# Patient Record
Sex: Female | Born: 1940 | Race: Black or African American | Hispanic: No | Smoking: Former smoker
Health system: Southern US, Community
[De-identification: ages and names within clinical notes are randomized; demographics above are authoritative.]

## PROBLEM LIST (undated history)

## (undated) DIAGNOSIS — I10 Essential (primary) hypertension: Secondary | ICD-10-CM

## (undated) DIAGNOSIS — Z8489 Family history of other specified conditions: Secondary | ICD-10-CM

## (undated) DIAGNOSIS — M1990 Unspecified osteoarthritis, unspecified site: Secondary | ICD-10-CM

## (undated) HISTORY — PX: JOINT REPLACEMENT: SHX530

## (undated) HISTORY — DX: Essential (primary) hypertension: I10

---

## 2004-05-13 ENCOUNTER — Other Ambulatory Visit: Admission: RE | Admit: 2004-05-13 | Discharge: 2004-05-13 | Payer: Self-pay

## 2006-04-22 ENCOUNTER — Encounter (INDEPENDENT_AMBULATORY_CARE_PROVIDER_SITE_OTHER): Payer: Self-pay | Admitting: Family Medicine

## 2006-11-30 ENCOUNTER — Ambulatory Visit: Payer: Self-pay | Admitting: Family Medicine

## 2006-11-30 DIAGNOSIS — E669 Obesity, unspecified: Secondary | ICD-10-CM | POA: Insufficient documentation

## 2006-11-30 DIAGNOSIS — I10 Essential (primary) hypertension: Secondary | ICD-10-CM | POA: Insufficient documentation

## 2006-11-30 DIAGNOSIS — M129 Arthropathy, unspecified: Secondary | ICD-10-CM | POA: Insufficient documentation

## 2006-11-30 LAB — CONVERTED CEMR LAB
Cholesterol, target level: 200 mg/dL
HDL goal, serum: 40 mg/dL
LDL Goal: 130 mg/dL

## 2006-12-04 ENCOUNTER — Encounter (INDEPENDENT_AMBULATORY_CARE_PROVIDER_SITE_OTHER): Payer: Self-pay | Admitting: Family Medicine

## 2006-12-06 ENCOUNTER — Telehealth (INDEPENDENT_AMBULATORY_CARE_PROVIDER_SITE_OTHER): Payer: Self-pay | Admitting: *Deleted

## 2006-12-08 LAB — CONVERTED CEMR LAB
ALT: 13 units/L (ref 0–35)
AST: 18 units/L (ref 0–37)
Basophils Absolute: 0 10*3/uL (ref 0.0–0.1)
Basophils Relative: 0 % (ref 0–1)
CO2: 27 meq/L (ref 19–32)
Calcium: 9.3 mg/dL (ref 8.4–10.5)
Chloride: 103 meq/L (ref 96–112)
Cholesterol: 195 mg/dL (ref 0–200)
Hemoglobin: 13.3 g/dL (ref 12.0–15.0)
Lymphocytes Relative: 41 % (ref 12–46)
MCHC: 31.7 g/dL (ref 30.0–36.0)
Neutro Abs: 3.6 10*3/uL (ref 1.7–7.7)
Neutrophils Relative %: 48 % (ref 43–77)
Platelets: 254 10*3/uL (ref 150–400)
RDW: 13.1 % (ref 11.5–15.5)
Sodium: 142 meq/L (ref 135–145)
TSH: 2.085 microintl units/mL (ref 0.350–5.50)
Total Protein: 7.2 g/dL (ref 6.0–8.3)
VLDL: 12 mg/dL (ref 0–40)

## 2006-12-09 ENCOUNTER — Encounter (INDEPENDENT_AMBULATORY_CARE_PROVIDER_SITE_OTHER): Payer: Self-pay | Admitting: Family Medicine

## 2007-01-07 ENCOUNTER — Ambulatory Visit: Payer: Self-pay | Admitting: Family Medicine

## 2007-01-07 DIAGNOSIS — R748 Abnormal levels of other serum enzymes: Secondary | ICD-10-CM | POA: Insufficient documentation

## 2007-01-07 LAB — CONVERTED CEMR LAB: LDL Goal: 160 mg/dL

## 2007-02-18 ENCOUNTER — Encounter (INDEPENDENT_AMBULATORY_CARE_PROVIDER_SITE_OTHER): Payer: Self-pay | Admitting: Family Medicine

## 2007-02-18 ENCOUNTER — Ambulatory Visit: Payer: Self-pay | Admitting: Family Medicine

## 2007-02-18 ENCOUNTER — Telehealth (INDEPENDENT_AMBULATORY_CARE_PROVIDER_SITE_OTHER): Payer: Self-pay | Admitting: *Deleted

## 2007-02-18 ENCOUNTER — Other Ambulatory Visit: Admission: RE | Admit: 2007-02-18 | Discharge: 2007-02-18 | Payer: Self-pay | Admitting: Family Medicine

## 2007-02-18 DIAGNOSIS — M949 Disorder of cartilage, unspecified: Secondary | ICD-10-CM

## 2007-02-18 DIAGNOSIS — M899 Disorder of bone, unspecified: Secondary | ICD-10-CM | POA: Insufficient documentation

## 2007-02-22 ENCOUNTER — Telehealth (INDEPENDENT_AMBULATORY_CARE_PROVIDER_SITE_OTHER): Payer: Self-pay | Admitting: *Deleted

## 2007-02-22 ENCOUNTER — Encounter (INDEPENDENT_AMBULATORY_CARE_PROVIDER_SITE_OTHER): Payer: Self-pay | Admitting: Family Medicine

## 2007-02-22 ENCOUNTER — Ambulatory Visit (HOSPITAL_COMMUNITY): Admission: RE | Admit: 2007-02-22 | Discharge: 2007-02-22 | Payer: Self-pay | Admitting: Family Medicine

## 2007-02-22 ENCOUNTER — Encounter: Payer: Self-pay | Admitting: Orthopedic Surgery

## 2007-02-23 ENCOUNTER — Telehealth (INDEPENDENT_AMBULATORY_CARE_PROVIDER_SITE_OTHER): Payer: Self-pay | Admitting: Family Medicine

## 2007-03-14 LAB — CONVERTED CEMR LAB: Pap Smear: NORMAL

## 2007-03-18 ENCOUNTER — Encounter (INDEPENDENT_AMBULATORY_CARE_PROVIDER_SITE_OTHER): Payer: Self-pay | Admitting: Family Medicine

## 2007-08-05 ENCOUNTER — Ambulatory Visit: Payer: Self-pay | Admitting: Family Medicine

## 2007-08-05 LAB — CONVERTED CEMR LAB
Bilirubin Urine: NEGATIVE
Blood in Urine, dipstick: NEGATIVE
Ketones, urine, test strip: NEGATIVE
Specific Gravity, Urine: 1.01
Urobilinogen, UA: 0.2

## 2007-08-06 ENCOUNTER — Encounter (INDEPENDENT_AMBULATORY_CARE_PROVIDER_SITE_OTHER): Payer: Self-pay | Admitting: Family Medicine

## 2007-08-09 LAB — CONVERTED CEMR LAB
BUN: 15 mg/dL (ref 6–23)
Calcium: 9.1 mg/dL (ref 8.4–10.5)
Glucose, Bld: 77 mg/dL (ref 70–99)
Potassium: 3.4 meq/L — ABNORMAL LOW (ref 3.5–5.3)

## 2007-12-08 ENCOUNTER — Encounter (INDEPENDENT_AMBULATORY_CARE_PROVIDER_SITE_OTHER): Payer: Self-pay | Admitting: Family Medicine

## 2007-12-29 ENCOUNTER — Ambulatory Visit: Payer: Self-pay | Admitting: Family Medicine

## 2008-04-23 ENCOUNTER — Ambulatory Visit: Payer: Self-pay | Admitting: Family Medicine

## 2008-04-28 ENCOUNTER — Encounter (INDEPENDENT_AMBULATORY_CARE_PROVIDER_SITE_OTHER): Payer: Self-pay | Admitting: Family Medicine

## 2008-05-01 ENCOUNTER — Encounter (INDEPENDENT_AMBULATORY_CARE_PROVIDER_SITE_OTHER): Payer: Self-pay | Admitting: *Deleted

## 2008-05-01 LAB — CONVERTED CEMR LAB
ALT: 12 units/L (ref 0–35)
AST: 18 units/L (ref 0–37)
Albumin: 3.9 g/dL (ref 3.5–5.2)
CO2: 27 meq/L (ref 19–32)
Calcium: 9.5 mg/dL (ref 8.4–10.5)
Chloride: 102 meq/L (ref 96–112)
Cholesterol: 181 mg/dL (ref 0–200)
Creatinine, Ser: 0.71 mg/dL (ref 0.40–1.20)
Lymphocytes Relative: 42 % (ref 12–46)
Lymphs Abs: 3.8 10*3/uL (ref 0.7–4.0)
MCV: 90.6 fL (ref 78.0–100.0)
Monocytes Relative: 10 % (ref 3–12)
Neutro Abs: 4.2 10*3/uL (ref 1.7–7.7)
Neutrophils Relative %: 46 % (ref 43–77)
Platelets: 284 10*3/uL (ref 150–400)
Potassium: 3.4 meq/L — ABNORMAL LOW (ref 3.5–5.3)
RBC: 4.45 M/uL (ref 3.87–5.11)
Sodium: 141 meq/L (ref 135–145)
TSH: 2.398 microintl units/mL (ref 0.350–4.500)
Total CHOL/HDL Ratio: 3
Total Protein: 7.5 g/dL (ref 6.0–8.3)
WBC: 9.1 10*3/uL (ref 4.0–10.5)

## 2008-07-18 ENCOUNTER — Ambulatory Visit: Payer: Self-pay | Admitting: Family Medicine

## 2008-07-18 DIAGNOSIS — E876 Hypokalemia: Secondary | ICD-10-CM | POA: Insufficient documentation

## 2008-07-18 LAB — CONVERTED CEMR LAB
Cholesterol, target level: 200 mg/dL
LDL Goal: 130 mg/dL

## 2008-07-19 ENCOUNTER — Encounter (INDEPENDENT_AMBULATORY_CARE_PROVIDER_SITE_OTHER): Payer: Self-pay | Admitting: Family Medicine

## 2008-07-19 LAB — CONVERTED CEMR LAB
BUN: 15 mg/dL (ref 6–23)
Calcium: 8.9 mg/dL (ref 8.4–10.5)
Glucose, Bld: 93 mg/dL (ref 70–99)
Sodium: 141 meq/L (ref 135–145)

## 2008-09-28 ENCOUNTER — Encounter (INDEPENDENT_AMBULATORY_CARE_PROVIDER_SITE_OTHER): Payer: Self-pay | Admitting: Family Medicine

## 2010-05-01 ENCOUNTER — Other Ambulatory Visit (HOSPITAL_COMMUNITY): Payer: Self-pay | Admitting: Internal Medicine

## 2010-05-01 DIAGNOSIS — Z139 Encounter for screening, unspecified: Secondary | ICD-10-CM

## 2010-05-07 ENCOUNTER — Ambulatory Visit (HOSPITAL_COMMUNITY)
Admission: RE | Admit: 2010-05-07 | Discharge: 2010-05-07 | Disposition: A | Discharge status: Home / Self Care | Payer: Medicare PPO | Source: Ambulatory Visit | Attending: Internal Medicine | Admitting: Internal Medicine

## 2010-05-07 DIAGNOSIS — Z139 Encounter for screening, unspecified: Secondary | ICD-10-CM

## 2010-05-07 DIAGNOSIS — Z13820 Encounter for screening for osteoporosis: Secondary | ICD-10-CM | POA: Insufficient documentation

## 2010-05-07 DIAGNOSIS — Z780 Asymptomatic menopausal state: Secondary | ICD-10-CM | POA: Insufficient documentation

## 2011-06-16 ENCOUNTER — Ambulatory Visit (HOSPITAL_COMMUNITY)
Admission: RE | Admit: 2011-06-16 | Discharge: 2011-06-16 | Disposition: A | Discharge status: Home / Self Care | Payer: Medicare HMO | Source: Ambulatory Visit | Attending: *Deleted | Admitting: *Deleted

## 2011-06-16 ENCOUNTER — Other Ambulatory Visit (HOSPITAL_COMMUNITY): Payer: Self-pay | Admitting: *Deleted

## 2011-06-16 DIAGNOSIS — M25669 Stiffness of unspecified knee, not elsewhere classified: Secondary | ICD-10-CM | POA: Insufficient documentation

## 2011-06-16 DIAGNOSIS — M25569 Pain in unspecified knee: Secondary | ICD-10-CM | POA: Insufficient documentation

## 2011-06-16 DIAGNOSIS — M25562 Pain in left knee: Secondary | ICD-10-CM

## 2011-06-16 DIAGNOSIS — M1710 Unilateral primary osteoarthritis, unspecified knee: Secondary | ICD-10-CM | POA: Insufficient documentation

## 2011-06-16 DIAGNOSIS — IMO0002 Reserved for concepts with insufficient information to code with codable children: Secondary | ICD-10-CM | POA: Insufficient documentation

## 2011-07-06 ENCOUNTER — Encounter: Payer: Self-pay | Admitting: Orthopedic Surgery

## 2011-07-06 ENCOUNTER — Ambulatory Visit (INDEPENDENT_AMBULATORY_CARE_PROVIDER_SITE_OTHER): Payer: Medicare HMO | Admitting: Orthopedic Surgery

## 2011-07-06 VITALS — BP 140/90 | Ht 67.0 in | Wt 197.0 lb

## 2011-07-06 DIAGNOSIS — M25469 Effusion, unspecified knee: Secondary | ICD-10-CM

## 2011-07-06 DIAGNOSIS — M25462 Effusion, left knee: Secondary | ICD-10-CM | POA: Insufficient documentation

## 2011-07-06 DIAGNOSIS — M25569 Pain in unspecified knee: Secondary | ICD-10-CM

## 2011-07-06 NOTE — Progress Notes (Signed)
  Subjective:    April Gordon is a 71 y.o. female who presents for an evaluation for spontaneous onset of LEFT knee effusion with no history of trauma. The patient went to her primary care doctor for a urinary tract infection, took some antibiotics and then started having swelling in the LEFT knee associated with throbbing 6/10. Constant pain, but no loss of motion.  Review of systems reported as normal. The following portions of the patient's history were reviewed and updated as appropriate: allergies, current medications, past family history, past medical history, past social history, past surgical history and problem list.   Review of Systems Pertinent items are noted in HPI.   Vital signs are stable as recorded  General appearance is normal  The patient is alert and oriented x3  The patient's mood and affect are normal  Gait assessment: normal  The cardiovascular exam reveals normal pulses and temperature without edema swelling.  The lymphatic system is negative for palpable lymph nodes  The sensory exam is normal.  There are no pathologic reflexes.  Balance is normal.   Exam of the LEFT knee, large joint effusion is noted. Range of motion stops at 90 of flexion. However, the knee remained stable. The muscle strength is normal. Her skin is intact.  Upper extremity exam  Inspection and palpation revealed no abnormalities in the upper extremities.  Range of motion is full without contracture.  Motor exam is normal with grade 5 strength.  The joints are fully reduced without subluxation.  There is no atrophy or tremor and muscle tone is normal.  All joints are stable.  RIGHT Lower extremity exam  Inspection and palpation revealed no tenderness or abnormality in alignment in the lower extremities. Range of motion is full.  Strength is grade 5. all joints are stable  The x-rays were done at the hospital. They show arthritis with effusion.  Impression LEFT knee joint  effusion with underlying arthritis.  Plan aspirate and inject his LEFT knee.  I expect spontaneous resolution of his of cortisone injection.  Follow up as needed    Knee Arthrocentesis with Injection Procedure Note  Pre-operative Diagnosis: left knee effusion  Post-operative Diagnosis: same  Indications: Symptomatic relief of large effusion  Anesthesia: Ethel chloride  Procedure Details   Verbal consent was obtained for the procedure. The joint was prepped with Alcohol lateral approach, 18-gauge needle inserted and the knee joint. 50 cc of cloudy yellow fluid was removed.  The knee was injected with 40 mg of Depo-Medrol and 3 cc 1% lidocaine.  No complications

## 2011-07-06 NOTE — Patient Instructions (Addendum)
You have received a steroid shot. 15% of patients experience increased pain at the injection site with in the next 24 hours. This is best treated with ice and tylenol extra strength 2 tabs every 8 hours. If you are still having pain please call the office.    

## 2013-09-18 ENCOUNTER — Ambulatory Visit (INDEPENDENT_AMBULATORY_CARE_PROVIDER_SITE_OTHER): Payer: Commercial Managed Care - HMO

## 2013-09-18 ENCOUNTER — Ambulatory Visit (INDEPENDENT_AMBULATORY_CARE_PROVIDER_SITE_OTHER): Payer: Commercial Managed Care - HMO | Admitting: Orthopedic Surgery

## 2013-09-18 VITALS — BP 149/81 | Ht 67.0 in | Wt 200.8 lb

## 2013-09-18 DIAGNOSIS — M25562 Pain in left knee: Secondary | ICD-10-CM

## 2013-09-18 DIAGNOSIS — M1710 Unilateral primary osteoarthritis, unspecified knee: Secondary | ICD-10-CM

## 2013-09-18 DIAGNOSIS — M170 Bilateral primary osteoarthritis of knee: Secondary | ICD-10-CM

## 2013-09-18 DIAGNOSIS — M25569 Pain in unspecified knee: Secondary | ICD-10-CM

## 2013-09-18 NOTE — Patient Instructions (Signed)
Joint Injection  Care After  Refer to this sheet in the next few days. These instructions provide you with information on caring for yourself after you have had a joint injection. Your caregiver also may give you more specific instructions. Your treatment has been planned according to current medical practices, but problems sometimes occur. Call your caregiver if you have any problems or questions after your procedure.  After any type of joint injection, it is not uncommon to experience:  · Soreness, swelling, or bruising around the injection site.  · Mild numbness, tingling, or weakness around the injection site caused by the numbing medicine used before or with the injection.  It also is possible to experience the following effects associated with the specific agent after injection:  · Iodine-based contrast agents:  ¨ Allergic reaction (itching, hives, widespread redness, and swelling beyond the injection site).  · Corticosteroids (These effects are rare.):  ¨ Allergic reaction.  ¨ Increased blood sugar levels (If you have diabetes and you notice that your blood sugar levels have increased, notify your caregiver).  ¨ Increased blood pressure levels.  ¨ Mood swings.  · Hyaluronic acid in the use of viscosupplementation.  ¨ Temporary heat or redness.  ¨ Temporary rash and itching.  ¨ Increased fluid accumulation in the injected joint.  These effects all should resolve within a day after your procedure.   HOME CARE INSTRUCTIONS  · Limit yourself to light activity the day of your procedure. Avoid lifting heavy objects, bending, stooping, or twisting.  · Take prescription or over-the-counter pain medication as directed by your caregiver.  · You may apply ice to your injection site to reduce pain and swelling the day of your procedure. Ice may be applied 03-04 times:  ¨ Put ice in a plastic bag.  ¨ Place a towel between your skin and the bag.  ¨ Leave the ice on for no longer than 15-20 minutes each time.  SEEK  IMMEDIATE MEDICAL CARE IF:   · Pain and swelling get worse rather than better or extend beyond the injection site.  · Numbness does not go away.  · Blood or fluid continues to leak from the injection site.  · You have chest pain.  · You have swelling of your face or tongue.  · You have trouble breathing or you become dizzy.  · You develop a fever, chills, or severe tenderness at the injection site that last longer than 1 day.  MAKE SURE YOU:  · Understand these instructions.  · Watch your condition.  · Get help right away if you are not doing well or if you get worse.  Document Released: 09/18/2010 Document Revised: 03/30/2011 Document Reviewed: 09/18/2010  ExitCare® Patient Information ©2015 [employer]. This information is not intended to replace advice given to you by your health care provider. Make sure you discuss any questions you have with your health care provider.

## 2013-09-21 NOTE — Progress Notes (Signed)
New problem established patient last seen 07/06/2011 presents now with bilateral knee pain left greater than right for about 6 weeks. She complains of throbbing aching pain and swelling with stiffness of both knees left worse than right it is increased after activity and worse at night she takes Aleve and Tylenol she's had trouble with these in the past. Review of systems joint pain stiffness joint spurring pain her legs a ON leg swelling otherwise normal medical history of hypertension arthritis did not list any surgery she says she takes hydrochlorothiazide and verapamil no allergies are listed. 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 Time out was completed.The joint was prepped with alcohol, followed by  Ethyl chloride spray and A 20 gauge needle was inserted into the knee via lateral approach; 74ml 1% lidocaine and 1 ml of depomedrol  was then injected into the joint . The needle was removed and the area cleansed and dressed.  Complications:  None; patient tolerated the procedure well. Knee  Injection Procedure Note  Pre-operative Diagnosis: right knee oa  Post-operative Diagnosis: same  Indications: pain  Anesthesia: ethyl chloride   Procedure Details    Verbal consent was obtained for the procedure. Time out was completed.The joint was prepped with alcohol, followed by  Ethyl chloride spray and A 20 gauge needle was inserted into the knee via lateral approach; 27ml 1% lidocaine and 1 ml of depomedrol  was then injected into the joint . The needle was removed and the area cleansed and dressed.  Complications:  None; patient tolerated the procedure well.

## 2014-02-14 DIAGNOSIS — M159 Polyosteoarthritis, unspecified: Secondary | ICD-10-CM | POA: Diagnosis not present

## 2014-02-14 DIAGNOSIS — M25561 Pain in right knee: Secondary | ICD-10-CM | POA: Diagnosis not present

## 2014-03-06 ENCOUNTER — Ambulatory Visit: Payer: Commercial Managed Care - HMO | Admitting: Orthopedic Surgery

## 2014-07-26 DIAGNOSIS — I10 Essential (primary) hypertension: Secondary | ICD-10-CM | POA: Diagnosis not present

## 2014-07-26 DIAGNOSIS — E782 Mixed hyperlipidemia: Secondary | ICD-10-CM | POA: Diagnosis not present

## 2014-07-31 DIAGNOSIS — M069 Rheumatoid arthritis, unspecified: Secondary | ICD-10-CM | POA: Diagnosis not present

## 2014-07-31 DIAGNOSIS — I10 Essential (primary) hypertension: Secondary | ICD-10-CM | POA: Diagnosis not present

## 2014-08-07 DIAGNOSIS — Z1231 Encounter for screening mammogram for malignant neoplasm of breast: Secondary | ICD-10-CM | POA: Diagnosis not present

## 2015-01-23 DIAGNOSIS — M25561 Pain in right knee: Secondary | ICD-10-CM | POA: Diagnosis not present

## 2015-01-23 DIAGNOSIS — I10 Essential (primary) hypertension: Secondary | ICD-10-CM | POA: Diagnosis not present

## 2015-01-23 DIAGNOSIS — M159 Polyosteoarthritis, unspecified: Secondary | ICD-10-CM | POA: Diagnosis not present

## 2015-02-02 DIAGNOSIS — I10 Essential (primary) hypertension: Secondary | ICD-10-CM | POA: Diagnosis not present

## 2015-02-02 DIAGNOSIS — M169 Osteoarthritis of hip, unspecified: Secondary | ICD-10-CM | POA: Diagnosis not present

## 2015-07-11 DIAGNOSIS — I10 Essential (primary) hypertension: Secondary | ICD-10-CM | POA: Diagnosis not present

## 2015-07-13 DIAGNOSIS — M169 Osteoarthritis of hip, unspecified: Secondary | ICD-10-CM | POA: Diagnosis not present

## 2015-07-13 DIAGNOSIS — I10 Essential (primary) hypertension: Secondary | ICD-10-CM | POA: Diagnosis not present

## 2015-12-06 DIAGNOSIS — Z23 Encounter for immunization: Secondary | ICD-10-CM | POA: Diagnosis not present

## 2016-03-24 DIAGNOSIS — I10 Essential (primary) hypertension: Secondary | ICD-10-CM | POA: Diagnosis not present

## 2016-04-01 DIAGNOSIS — I10 Essential (primary) hypertension: Secondary | ICD-10-CM | POA: Diagnosis not present

## 2016-04-01 DIAGNOSIS — Z0000 Encounter for general adult medical examination without abnormal findings: Secondary | ICD-10-CM | POA: Diagnosis not present

## 2016-04-30 ENCOUNTER — Other Ambulatory Visit: Payer: Self-pay | Admitting: Internal Medicine

## 2016-04-30 ENCOUNTER — Ambulatory Visit (HOSPITAL_COMMUNITY)
Admission: RE | Admit: 2016-04-30 | Discharge: 2016-04-30 | Disposition: A | Discharge status: Home / Self Care | Payer: Medicare HMO | Source: Ambulatory Visit | Attending: Internal Medicine | Admitting: Internal Medicine

## 2016-04-30 DIAGNOSIS — M1611 Unilateral primary osteoarthritis, right hip: Secondary | ICD-10-CM | POA: Insufficient documentation

## 2016-04-30 DIAGNOSIS — M25551 Pain in right hip: Secondary | ICD-10-CM

## 2016-04-30 DIAGNOSIS — R102 Pelvic and perineal pain: Secondary | ICD-10-CM | POA: Diagnosis not present

## 2016-04-30 DIAGNOSIS — Z0000 Encounter for general adult medical examination without abnormal findings: Secondary | ICD-10-CM | POA: Diagnosis not present

## 2016-05-04 DIAGNOSIS — R1030 Lower abdominal pain, unspecified: Secondary | ICD-10-CM | POA: Diagnosis not present

## 2016-05-04 DIAGNOSIS — M791 Myalgia: Secondary | ICD-10-CM | POA: Diagnosis not present

## 2016-05-04 DIAGNOSIS — M159 Polyosteoarthritis, unspecified: Secondary | ICD-10-CM | POA: Diagnosis not present

## 2016-05-20 DIAGNOSIS — M1611 Unilateral primary osteoarthritis, right hip: Secondary | ICD-10-CM | POA: Diagnosis not present

## 2016-05-20 DIAGNOSIS — M25551 Pain in right hip: Secondary | ICD-10-CM | POA: Diagnosis not present

## 2016-06-04 ENCOUNTER — Other Ambulatory Visit (HOSPITAL_COMMUNITY): Payer: Self-pay | Admitting: Emergency Medicine

## 2016-06-04 NOTE — Patient Instructions (Signed)
April Gordon  06/04/2016   Your procedure is scheduled on: 06-16-16  Report to April Gordon  Entrance  Report to admitting at [facility]   Call this number if you have problems the morning of surgery [phone]   Remember: ONLY 1 PERSON MAY GO WITH YOU TO SHORT STAY TO GET  READY MORNING OF YOUR SURGERY.  Do not eat food or drink liquids :After Midnight.     Take these medicines the morning of surgery with A SIP OF WATER: verapamil                                 You may not have any metal on your body including hair pins and              piercings  Do not wear jewelry, make-up, lotions, powders or perfumes, deodorant             Do not wear nail polish.  Do not shave  48 hours prior to surgery.          Do not bring valuables to the hospital. April Gordon.  Contacts, dentures or bridgework may not be worn into surgery.  Leave suitcase in the car. After surgery it may be brought to your room.                Please read over the following fact sheets you were given: _____________________________________________________________________             [facility] - Preparing for Surgery Before surgery, you can play an important role.  Because skin is not sterile, your skin needs to be as free of germs as possible.  You can reduce the number of germs on your skin by washing with CHG (chlorahexidine gluconate) soap before surgery.  CHG is an antiseptic cleaner which kills germs and bonds with the skin to continue killing germs even after washing. Please DO NOT use if you have an allergy to CHG or antibacterial soaps.  If your skin becomes reddened/irritated stop using the CHG and inform your nurse when you arrive at Short Stay. Do not shave (including legs and underarms) for at least 48 hours prior to the first CHG shower.  You may shave your face/neck. Please follow these instructions carefully:  1.  Shower with CHG  Soap the night before surgery and the  morning of Surgery.  2.  If you choose to wash your hair, wash your hair first as usual with your  normal  shampoo.  3.  After you shampoo, rinse your hair and body thoroughly to remove the  shampoo.                           4.  Use CHG as you would any other liquid soap.  You can apply chg directly  to the skin and wash                       Gently with a scrungie or clean washcloth.  5.  Apply the CHG Soap to your body ONLY FROM THE NECK DOWN.   Do not use on face/ open  Wound or open sores. Avoid contact with eyes, ears mouth and genitals (private parts).                       Wash face,  Genitals (private parts) with your normal soap.             6.  Wash thoroughly, paying special attention to the area where your surgery  will be performed.  7.  Thoroughly rinse your body with warm water from the neck down.  8.  DO NOT shower/wash with your normal soap after using and rinsing off  the CHG Soap.                9.  Pat yourself dry with a clean towel.            10.  Wear clean pajamas.            11.  Place clean sheets on your bed the night of your first shower and do not  sleep with pets. Day of Surgery : Do not apply any lotions/deodorants the morning of surgery.  Please wear clean clothes to the hospital/surgery center.  FAILURE TO FOLLOW THESE INSTRUCTIONS MAY RESULT IN THE CANCELLATION OF YOUR SURGERY PATIENT SIGNATURE_________________________________  NURSE SIGNATURE__________________________________  ________________________________________________________________________   April Gordon  An incentive spirometer is a tool that can help keep your lungs clear and active. This tool measures how well you are filling your lungs with each breath. Taking long deep breaths may help reverse or decrease the chance of developing breathing (pulmonary) problems (especially infection) following:  A long period of time  when you are unable to move or be active. BEFORE THE PROCEDURE   If the spirometer includes an indicator to show your best effort, your nurse or respiratory therapist will set it to a desired goal.  If possible, sit up straight or lean slightly forward. Try not to slouch.  Hold the incentive spirometer in an upright position. INSTRUCTIONS FOR USE  1. Sit on the edge of your bed if possible, or sit up as far as you can in bed or on a chair. 2. Hold the incentive spirometer in an upright position. 3. Breathe out normally. 4. Place the mouthpiece in your mouth and seal your lips tightly around it. 5. Breathe in slowly and as deeply as possible, raising the piston or the ball toward the top of the column. 6. Hold your breath for 3-5 seconds or for as long as possible. Allow the piston or ball to fall to the bottom of the column. 7. Remove the mouthpiece from your mouth and breathe out normally. 8. Rest for a few seconds and repeat Steps 1 through 7 at least 10 times every 1-2 hours when you are awake. Take your time and take a few normal breaths between deep breaths. 9. The spirometer may include an indicator to show your best effort. Use the indicator as a goal to work toward during each repetition. 10. After each set of 10 deep breaths, practice coughing to be sure your lungs are clear. If you have an incision (the cut made at the time of surgery), support your incision when coughing by placing a pillow or rolled up towels firmly against it. Once you are able to get out of bed, walk around indoors and cough well. You may stop using the incentive spirometer when instructed by your caregiver.  RISKS AND COMPLICATIONS  Take your time so you do not get  dizzy or light-headed.  If you are in pain, you may need to take or ask for pain medication before doing incentive spirometry. It is harder to take a deep breath if you are having pain. AFTER USE  Rest and breathe slowly and easily.  It can be  helpful to keep track of a log of your progress. Your caregiver can provide you with a simple table to help with this. If you are using the spirometer at home, follow these instructions: [place] IF:   You are having difficultly using the spirometer.  You have trouble using the spirometer as often as instructed.  Your pain medication is not giving enough relief while using the spirometer.  You develop fever of 100.5 F (38.1 C) or higher. SEEK IMMEDIATE MEDICAL CARE IF:   You cough up bloody sputum that had not been present before.  You develop fever of 102 F (38.9 C) or greater.  You develop worsening pain at or near the incision site. MAKE SURE YOU:   Understand these instructions.  Will watch your condition.  Will get help right away if you are not doing well or get worse. Document Released: 05/18/2006 Document Revised: 03/30/2011 Document Reviewed: 07/19/2006 Exit[employer] Patient Information 2014 [employer], [place].   ________________________________________________________________________  WHAT IS A BLOOD TRANSFUSION? Blood Transfusion Information  A transfusion is the replacement of blood or some of its parts. Blood is made up of multiple cells which provide different functions.  Red blood cells carry oxygen and are used for blood loss replacement.  White blood cells fight against infection.  Platelets control bleeding.  Plasma helps clot blood.  Other blood products are available for specialized needs, such as hemophilia or other clotting disorders. BEFORE THE TRANSFUSION  Who gives blood for transfusions?   Healthy volunteers who are fully evaluated to make sure their blood is safe. This is blood bank blood. Transfusion therapy is the safest it has ever been in the practice of medicine. Before blood is taken from a donor, a complete history is taken to make sure that person has no history of diseases nor engages in risky social behavior (examples are  intravenous drug use or sexual activity with multiple partners). The donor's travel history is screened to minimize risk of transmitting infections, such as malaria. The donated blood is tested for signs of infectious diseases, such as HIV and hepatitis. The blood is then tested to be sure it is compatible with you in order to minimize the chance of a transfusion reaction. If you or a relative donates blood, this is often done in anticipation of surgery and is not appropriate for emergency situations. It takes many days to process the donated blood. RISKS AND COMPLICATIONS Although transfusion therapy is very safe and saves many lives, the main dangers of transfusion include:   Getting an infectious disease.  Developing a transfusion reaction. This is an allergic reaction to something in the blood you were given. Every precaution is taken to prevent this. The decision to have a blood transfusion has been considered carefully by your caregiver before blood is given. Blood is not given unless the benefits outweigh the risks. AFTER THE TRANSFUSION  Right after receiving a blood transfusion, you will usually feel much better and more energetic. This is especially true if your red blood cells have gotten low (anemic). The transfusion raises the level of the red blood cells which carry oxygen, and this usually causes an energy increase.  The nurse administering the transfusion will  monitor you carefully for complications. HOME CARE INSTRUCTIONS  No special instructions are needed after a transfusion. You may find your energy is better. Speak with your caregiver about any limitations on activity for underlying diseases you may have. SEEK MEDICAL CARE IF:   Your condition is not improving after your transfusion.  You develop redness or irritation at the intravenous (IV) site. SEEK IMMEDIATE MEDICAL CARE IF:  Any of the following symptoms occur over the next 12 hours:  Shaking chills.  You have a  temperature by mouth above 102 F (38.9 C), not controlled by medicine.  Chest, back, or muscle pain.  People around you feel you are not acting correctly or are confused.  Shortness of breath or difficulty breathing.  Dizziness and fainting.  You get a rash or develop hives.  You have a decrease in urine output.  Your urine turns a dark color or changes to pink, red, or brown. Any of the following symptoms occur over the next 10 days:  You have a temperature by mouth above 102 F (38.9 C), not controlled by medicine.  Shortness of breath.  Weakness after normal activity.  The white part of the eye turns yellow (jaundice).  You have a decrease in the amount of urine or are urinating less often.  Your urine turns a dark color or changes to pink, red, or brown. Document Released: 01/03/2000 Document Revised: 03/30/2011 Document Reviewed: 08/22/2007 [facility] Patient Information 2014 [place], [place].  _______________________________________________________________________

## 2016-06-05 ENCOUNTER — Encounter (HOSPITAL_COMMUNITY)
Admission: RE | Admit: 2016-06-05 | Discharge: 2016-06-05 | Disposition: A | Discharge status: Home / Self Care | Payer: Medicare HMO | Source: Ambulatory Visit | Attending: Orthopedic Surgery | Admitting: Orthopedic Surgery

## 2016-06-05 ENCOUNTER — Encounter (HOSPITAL_COMMUNITY): Payer: Self-pay

## 2016-06-05 ENCOUNTER — Encounter (INDEPENDENT_AMBULATORY_CARE_PROVIDER_SITE_OTHER): Payer: Self-pay

## 2016-06-05 DIAGNOSIS — Z01810 Encounter for preprocedural cardiovascular examination: Secondary | ICD-10-CM | POA: Diagnosis not present

## 2016-06-05 DIAGNOSIS — Z01812 Encounter for preprocedural laboratory examination: Secondary | ICD-10-CM | POA: Insufficient documentation

## 2016-06-05 DIAGNOSIS — M1611 Unilateral primary osteoarthritis, right hip: Secondary | ICD-10-CM | POA: Diagnosis not present

## 2016-06-05 HISTORY — DX: Unspecified osteoarthritis, unspecified site: M19.90

## 2016-06-05 LAB — CBC
HEMATOCRIT: 38.9 % (ref 36.0–46.0)
HEMOGLOBIN: 13 g/dL (ref 12.0–15.0)
MCH: 30.6 pg (ref 26.0–34.0)
MCHC: 33.4 g/dL (ref 30.0–36.0)
MCV: 91.5 fL (ref 78.0–100.0)
Platelets: 259 10*3/uL (ref 150–400)
RBC: 4.25 MIL/uL (ref 3.87–5.11)
RDW: 13.1 % (ref 11.5–15.5)
WBC: 8.1 10*3/uL (ref 4.0–10.5)

## 2016-06-05 LAB — BASIC METABOLIC PANEL
ANION GAP: 8 (ref 5–15)
BUN: 22 mg/dL — ABNORMAL HIGH (ref 6–20)
CALCIUM: 9.5 mg/dL (ref 8.9–10.3)
CHLORIDE: 104 mmol/L (ref 101–111)
CO2: 29 mmol/L (ref 22–32)
Creatinine, Ser: 0.78 mg/dL (ref 0.44–1.00)
GFR calc non Af Amer: >60 mL/min (ref >60)
GLUCOSE: 90 mg/dL (ref 65–99)
POTASSIUM: 4.3 mmol/L (ref 3.5–5.1)
Sodium: 141 mmol/L (ref 135–145)

## 2016-06-05 LAB — TYPE AND SCREEN
ABO/RH(D): O POS
ANTIBODY SCREEN: NEGATIVE

## 2016-06-05 LAB — ABO/RH: ABO/RH(D): O POS

## 2016-06-05 LAB — SURGICAL PCR SCREEN
MRSA, PCR: NEGATIVE
Staphylococcus aureus: POSITIVE — AB

## 2016-06-05 NOTE — Progress Notes (Signed)
[address]ance Dr [place] Crane  05-27-16 on chart

## 2016-06-10 NOTE — H&P (Signed)
TOTAL HIP ADMISSION H&P  Patient is admitted for right total hip arthroplasty, anterior approach.  Subjective:  Chief Complaint:    Right hip primary OA / pain  HPI: April Gordon, 76 y.o. female, has a history of pain and functional disability in the right hip(s) due to arthritis and patient has failed non-surgical conservative treatments for greater than 12 weeks to include NSAID's and/or analgesics, use of assistive devices and activity modification.  Onset of symptoms was gradual starting years ago with gradually worsening course since that time.The patient noted no past surgery on the right hip(s).  Patient currently rates pain in the right hip at 9 out of 10 with activity. Patient has worsening of pain with activity and weight bearing, trendelenberg gait, pain that interfers with activities of daily living and pain with passive range of motion. Patient has evidence of periarticular osteophytes and joint subluxation by imaging studies. This condition presents safety issues increasing the risk of falls.  There is no current active infection.   Risks, benefits and expectations were discussed with the patient.  Risks including but not limited to the risk of anesthesia, blood clots, nerve damage, blood vessel damage, failure of the prosthesis, infection and up to and including death.  Patient understand the risks, benefits and expectations and wishes to proceed with surgery.   PCP: Celene Squibb, MD  D/C Plans:       Home   Post-op Meds:       No Rx given   Tranexamic Acid:      To be given - IV   Decadron:      Is to be given  FYI:     ASA  [place]  PT: No PT    Patient Active Problem List   Diagnosis Date Noted  . Effusion of knee joint, left 07/06/2011  . HYPOKALEMIA 07/18/2008  . OSTEOPENIA 02/18/2007  . ALKALINE PHOSPHATASE, ELEVATED 01/07/2007  . OBESITY 11/30/2006  . HYPERTENSION 11/30/2006  . ARTHRITIS 11/30/2006   Past Medical History:  Diagnosis Date  . Arthritis   . HTN  (hypertension)     No past surgical history on file.  No prescriptions prior to admission.   No Known Allergies   Social History  Substance Use Topics  . Smoking status: Former Smoker    Years: 6.00  . Smokeless tobacco: Never Used  . Alcohol use No    Family History  Problem Relation Age of Onset  . Arthritis Unknown      Review of Systems  Constitutional: Negative.   HENT: Negative.   Eyes: Negative.   Respiratory: Negative.   Cardiovascular: Negative.   Gastrointestinal: Negative.   Genitourinary: Negative.   Musculoskeletal: Positive for joint pain.  Skin: Negative.   Neurological: Negative.   Endo/Heme/Allergies: Negative.   Psychiatric/Behavioral: Negative.     Objective:  Physical Exam  Constitutional: She is oriented to person, place, and time. She appears well-developed.  HENT:  Head: Normocephalic.  Eyes: Pupils are equal, round, and reactive to light.  Neck: Neck supple. No JVD present. No tracheal deviation present. No thyromegaly present.  Cardiovascular: Normal rate, regular rhythm and intact distal pulses.   Respiratory: Effort normal and breath sounds normal. No respiratory distress. She has no wheezes.  GI: Soft. There is no tenderness. There is no guarding.  Musculoskeletal:       Right hip: She exhibits decreased range of motion, decreased strength, tenderness and bony tenderness. She exhibits no swelling, no deformity and no laceration.  Lymphadenopathy:    She has no cervical adenopathy.  Neurological: She is alert and oriented to person, place, and time.  Skin: Skin is warm and dry.  Psychiatric: She has a normal mood and affect.      Labs:  Estimated body mass index is 31.28 kg/m as calculated from the following:   Height as of 06/05/16: 5\' 6"  (1.676 m).   Weight as of 06/05/16: 87.9 kg (193 lb 12.8 oz).   Imaging Review Plain radiographs demonstrate severe degenerative joint disease of the right hip(s). The bone quality appears to  be good for age and reported activity level.  Assessment/Plan:  End stage arthritis, right hip(s)  The patient history, physical examination, clinical judgement of the provider and imaging studies are consistent with end stage degenerative joint disease of the right hip(s) and total hip arthroplasty is deemed medically necessary. The treatment options including medical management, injection therapy, arthroscopy and arthroplasty were discussed at length. The risks and benefits of total hip arthroplasty were presented and reviewed. The risks due to aseptic loosening, infection, stiffness, dislocation/subluxation,  thromboembolic complications and other imponderables were discussed.  The patient acknowledged the explanation, agreed to proceed with the plan and consent was signed. Patient is being admitted for inpatient treatment for surgery, pain control, PT, OT, prophylactic antibiotics, VTE prophylaxis, progressive ambulation and ADL's and discharge planning.The patient is planning to be discharged home.     West Pugh Yunus [place]   PA-C  06/10/2016, 1:56 PM

## 2016-06-16 ENCOUNTER — Inpatient Hospital Stay (HOSPITAL_COMMUNITY): Payer: Medicare HMO

## 2016-06-16 ENCOUNTER — Encounter (HOSPITAL_COMMUNITY)
Admission: RE | Disposition: A | Discharge status: Home / Self Care | Payer: Self-pay | Source: Ambulatory Visit | Attending: Orthopedic Surgery

## 2016-06-16 ENCOUNTER — Inpatient Hospital Stay (HOSPITAL_COMMUNITY): Payer: Medicare HMO | Admitting: Anesthesiology

## 2016-06-16 ENCOUNTER — Inpatient Hospital Stay (HOSPITAL_COMMUNITY)
Admission: RE | Admit: 2016-06-16 | Discharge: 2016-06-17 | DRG: 470 | Disposition: A | Discharge status: Home / Self Care | Payer: Medicare HMO | Source: Ambulatory Visit | Attending: Orthopedic Surgery | Admitting: Orthopedic Surgery

## 2016-06-16 ENCOUNTER — Encounter (HOSPITAL_COMMUNITY): Payer: Self-pay | Admitting: General Practice

## 2016-06-16 DIAGNOSIS — Z96641 Presence of right artificial hip joint: Secondary | ICD-10-CM | POA: Diagnosis not present

## 2016-06-16 DIAGNOSIS — E669 Obesity, unspecified: Secondary | ICD-10-CM | POA: Diagnosis not present

## 2016-06-16 DIAGNOSIS — M1611 Unilateral primary osteoarthritis, right hip: Secondary | ICD-10-CM | POA: Diagnosis not present

## 2016-06-16 DIAGNOSIS — Z96649 Presence of unspecified artificial hip joint: Secondary | ICD-10-CM

## 2016-06-16 DIAGNOSIS — I10 Essential (primary) hypertension: Secondary | ICD-10-CM | POA: Diagnosis not present

## 2016-06-16 DIAGNOSIS — M25551 Pain in right hip: Secondary | ICD-10-CM

## 2016-06-16 DIAGNOSIS — Z6831 Body mass index (BMI) 31.0-31.9, adult: Secondary | ICD-10-CM

## 2016-06-16 DIAGNOSIS — E876 Hypokalemia: Secondary | ICD-10-CM | POA: Diagnosis not present

## 2016-06-16 DIAGNOSIS — M8580 Other specified disorders of bone density and structure, unspecified site: Secondary | ICD-10-CM | POA: Diagnosis not present

## 2016-06-16 DIAGNOSIS — Z87891 Personal history of nicotine dependence: Secondary | ICD-10-CM

## 2016-06-16 DIAGNOSIS — Z472 Encounter for removal of internal fixation device: Secondary | ICD-10-CM | POA: Diagnosis not present

## 2016-06-16 HISTORY — PX: TOTAL HIP ARTHROPLASTY: SHX124

## 2016-06-16 SURGERY — ARTHROPLASTY, HIP, TOTAL, ANTERIOR APPROACH
Anesthesia: Spinal | Site: Hip | Laterality: Right

## 2016-06-16 MED ORDER — FERROUS SULFATE 325 (65 FE) MG PO TABS: 325.0000 mg | ORAL_TABLET | Freq: Three times a day (TID) | ORAL | Status: DC

## 2016-06-16 MED ORDER — ONDANSETRON HCL 4 MG/2ML IJ SOLN: 4.0000 mg | Freq: Once | INTRAMUSCULAR | Status: DC | PRN

## 2016-06-16 MED ORDER — ONDANSETRON HCL 4 MG/2ML IJ SOLN
INTRAMUSCULAR | Status: AC
  Filled 2016-06-16: qty 2

## 2016-06-16 MED ORDER — BISACODYL 10 MG RE SUPP: 10.0000 mg | Freq: Every day | RECTAL | Status: DC | PRN

## 2016-06-16 MED ORDER — PHENYLEPHRINE 40 MCG/ML (10ML) SYRINGE FOR IV PUSH (FOR BLOOD PRESSURE SUPPORT)
PREFILLED_SYRINGE | INTRAVENOUS | Status: DC | PRN
  Administered 2016-06-16 (×4): 80 ug via INTRAVENOUS

## 2016-06-16 MED ORDER — LIDOCAINE 2% (20 MG/ML) 5 ML SYRINGE
INTRAMUSCULAR | Status: AC
  Filled 2016-06-16: qty 5

## 2016-06-16 MED ORDER — CEFAZOLIN SODIUM-DEXTROSE 2-4 GM/100ML-% IV SOLN
2.0000 g | INTRAVENOUS | Status: AC
  Administered 2016-06-16: 2 g via INTRAVENOUS

## 2016-06-16 MED ORDER — HYDROCODONE-ACETAMINOPHEN 7.5-325 MG PO TABS
1.0000 | ORAL_TABLET | ORAL | Status: DC
  Administered 2016-06-16 (×2): 1 via ORAL
  Administered 2016-06-16: 2 via ORAL
  Administered 2016-06-17 (×4): 1 via ORAL
  Filled 2016-06-16 (×2): qty 2
  Filled 2016-06-16: qty 1
  Filled 2016-06-16: qty 2
  Filled 2016-06-16 (×3): qty 1

## 2016-06-16 MED ORDER — ALUM & MAG HYDROXIDE-SIMETH 200-200-20 MG/5ML PO SUSP: 15.0000 mL | ORAL | Status: DC | PRN

## 2016-06-16 MED ORDER — DEXAMETHASONE SODIUM PHOSPHATE 10 MG/ML IJ SOLN
10.0000 mg | Freq: Once | INTRAMUSCULAR | Status: AC
  Administered 2016-06-17: 10 mg via INTRAVENOUS
  Filled 2016-06-16: qty 1

## 2016-06-16 MED ORDER — BUPIVACAINE IN DEXTROSE 0.75-8.25 % IT SOLN
INTRATHECAL | Status: DC | PRN
  Administered 2016-06-16: 2 mL via INTRATHECAL

## 2016-06-16 MED ORDER — EPHEDRINE SULFATE-NACL 50-0.9 MG/10ML-% IV SOSY
PREFILLED_SYRINGE | INTRAVENOUS | Status: DC | PRN
  Administered 2016-06-16 (×2): 10 mg via INTRAVENOUS

## 2016-06-16 MED ORDER — PHENYLEPHRINE 40 MCG/ML (10ML) SYRINGE FOR IV PUSH (FOR BLOOD PRESSURE SUPPORT)
PREFILLED_SYRINGE | INTRAVENOUS | Status: AC
  Filled 2016-06-16: qty 10

## 2016-06-16 MED ORDER — ONDANSETRON HCL 4 MG PO TABS: 4.0000 mg | ORAL_TABLET | Freq: Four times a day (QID) | ORAL | Status: DC | PRN

## 2016-06-16 MED ORDER — FENTANYL CITRATE (PF) 100 MCG/2ML IJ SOLN
INTRAMUSCULAR | Status: AC
  Filled 2016-06-16: qty 2

## 2016-06-16 MED ORDER — DOCUSATE SODIUM 100 MG PO CAPS: 100.0000 mg | ORAL_CAPSULE | Freq: Two times a day (BID) | ORAL | 0 refills | Status: DC

## 2016-06-16 MED ORDER — HYDROCHLOROTHIAZIDE 25 MG PO TABS
50.0000 mg | ORAL_TABLET | Freq: Every day | ORAL | Status: DC
  Administered 2016-06-16 – 2016-06-17 (×2): 50 mg via ORAL
  Filled 2016-06-16 (×2): qty 2

## 2016-06-16 MED ORDER — VERAPAMIL HCL ER 240 MG PO TBCR
240.0000 mg | EXTENDED_RELEASE_TABLET | Freq: Every day | ORAL | Status: DC
  Filled 2016-06-16: qty 1

## 2016-06-16 MED ORDER — METOCLOPRAMIDE HCL 5 MG PO TABS
5.0000 mg | ORAL_TABLET | Freq: Three times a day (TID) | ORAL | Status: DC | PRN
Start: 2016-06-16 — End: 2016-06-17

## 2016-06-16 MED ORDER — HYDROMORPHONE HCL 1 MG/ML IJ SOLN: 0.2500 mg | INTRAMUSCULAR | Status: DC | PRN

## 2016-06-16 MED ORDER — PHENOL 1.4 % MT LIQD
1.0000 | OROMUCOSAL | Status: DC | PRN
  Filled 2016-06-16: qty 177

## 2016-06-16 MED ORDER — EPHEDRINE 5 MG/ML INJ
INTRAVENOUS | Status: AC
  Filled 2016-06-16: qty 10

## 2016-06-16 MED ORDER — DIPHENHYDRAMINE HCL 25 MG PO CAPS: 25.0000 mg | ORAL_CAPSULE | Freq: Four times a day (QID) | ORAL | Status: DC | PRN

## 2016-06-16 MED ORDER — PROPOFOL 500 MG/50ML IV EMUL
INTRAVENOUS | Status: DC | PRN
  Administered 2016-06-16: 50 ug/kg/min via INTRAVENOUS

## 2016-06-16 MED ORDER — LIDOCAINE 2% (20 MG/ML) 5 ML SYRINGE
INTRAMUSCULAR | Status: DC | PRN
  Administered 2016-06-16: 40 mg via INTRAVENOUS

## 2016-06-16 MED ORDER — HYDROCODONE-ACETAMINOPHEN 7.5-325 MG PO TABS: 1.0000 | ORAL_TABLET | ORAL | 0 refills | Status: DC | PRN

## 2016-06-16 MED ORDER — METHOCARBAMOL 1000 MG/10ML IJ SOLN
500.0000 mg | Freq: Four times a day (QID) | INTRAVENOUS | Status: DC | PRN
  Administered 2016-06-16: 500 mg via INTRAVENOUS
  Filled 2016-06-16: qty 550

## 2016-06-16 MED ORDER — SODIUM CHLORIDE 0.9 % IV SOLN
INTRAVENOUS | Status: DC
  Administered 2016-06-16: 100 mL/h via INTRAVENOUS
  Administered 2016-06-17: 03:00:00 via INTRAVENOUS

## 2016-06-16 MED ORDER — METOCLOPRAMIDE HCL 5 MG/ML IJ SOLN: 5.0000 mg | Freq: Three times a day (TID) | INTRAMUSCULAR | Status: DC | PRN

## 2016-06-16 MED ORDER — STERILE WATER FOR IRRIGATION IR SOLN
Status: DC | PRN
  Administered 2016-06-16: 2000 mL

## 2016-06-16 MED ORDER — LACTATED RINGERS IV SOLN
INTRAVENOUS | Status: DC
  Administered 2016-06-16 (×3): via INTRAVENOUS

## 2016-06-16 MED ORDER — ASPIRIN 81 MG PO CHEW
81.0000 mg | CHEWABLE_TABLET | Freq: Two times a day (BID) | ORAL | Status: DC
  Administered 2016-06-16 – 2016-06-17 (×2): 81 mg via ORAL
  Filled 2016-06-16 (×2): qty 1

## 2016-06-16 MED ORDER — MEPERIDINE HCL 50 MG/ML IJ SOLN: 6.2500 mg | INTRAMUSCULAR | Status: DC | PRN

## 2016-06-16 MED ORDER — FENTANYL CITRATE (PF) 100 MCG/2ML IJ SOLN
INTRAMUSCULAR | Status: DC | PRN
  Administered 2016-06-16 (×2): 50 ug via INTRAVENOUS

## 2016-06-16 MED ORDER — CELECOXIB 200 MG PO CAPS
200.0000 mg | ORAL_CAPSULE | Freq: Two times a day (BID) | ORAL | Status: DC
  Administered 2016-06-16 – 2016-06-17 (×2): 200 mg via ORAL
  Filled 2016-06-16 (×2): qty 1

## 2016-06-16 MED ORDER — POLYETHYLENE GLYCOL 3350 17 G PO PACK: 17.0000 g | PACK | Freq: Two times a day (BID) | ORAL | 0 refills | Status: DC

## 2016-06-16 MED ORDER — CEFAZOLIN SODIUM-DEXTROSE 2-4 GM/100ML-% IV SOLN
INTRAVENOUS | Status: AC
  Filled 2016-06-16: qty 100

## 2016-06-16 MED ORDER — DEXAMETHASONE SODIUM PHOSPHATE 10 MG/ML IJ SOLN
10.0000 mg | Freq: Once | INTRAMUSCULAR | Status: AC
  Administered 2016-06-16: 10 mg via INTRAVENOUS

## 2016-06-16 MED ORDER — METHOCARBAMOL 500 MG PO TABS: 500.0000 mg | ORAL_TABLET | Freq: Four times a day (QID) | ORAL | 0 refills | Status: DC | PRN

## 2016-06-16 MED ORDER — ASPIRIN 81 MG PO CHEW: 81.0000 mg | CHEWABLE_TABLET | Freq: Two times a day (BID) | ORAL | 0 refills | Status: AC

## 2016-06-16 MED ORDER — PROPOFOL 10 MG/ML IV BOLUS
INTRAVENOUS | Status: AC
  Filled 2016-06-16: qty 60

## 2016-06-16 MED ORDER — CEFAZOLIN SODIUM-DEXTROSE 2-4 GM/100ML-% IV SOLN
2.0000 g | Freq: Four times a day (QID) | INTRAVENOUS | Status: AC
  Administered 2016-06-16 (×2): 2 g via INTRAVENOUS
  Filled 2016-06-16 (×2): qty 100

## 2016-06-16 MED ORDER — SODIUM CHLORIDE 0.9 % IR SOLN
Status: DC | PRN
  Administered 2016-06-16: 1000 mL

## 2016-06-16 MED ORDER — POLYETHYLENE GLYCOL 3350 17 G PO PACK
17.0000 g | PACK | Freq: Two times a day (BID) | ORAL | Status: DC
  Administered 2016-06-16: 17 g via ORAL
  Filled 2016-06-16: qty 1

## 2016-06-16 MED ORDER — ONDANSETRON HCL 4 MG/2ML IJ SOLN
INTRAMUSCULAR | Status: DC | PRN
  Administered 2016-06-16: 4 mg via INTRAVENOUS

## 2016-06-16 MED ORDER — ONDANSETRON HCL 4 MG/2ML IJ SOLN: 4.0000 mg | Freq: Four times a day (QID) | INTRAMUSCULAR | Status: DC | PRN

## 2016-06-16 MED ORDER — DEXAMETHASONE SODIUM PHOSPHATE 10 MG/ML IJ SOLN
INTRAMUSCULAR | Status: AC
  Filled 2016-06-16: qty 1

## 2016-06-16 MED ORDER — FENTANYL CITRATE (PF) 100 MCG/2ML IJ SOLN: 25.0000 ug | INTRAMUSCULAR | Status: DC | PRN

## 2016-06-16 MED ORDER — PROPOFOL 10 MG/ML IV BOLUS
INTRAVENOUS | Status: DC | PRN
  Administered 2016-06-16: 20 mg via INTRAVENOUS

## 2016-06-16 MED ORDER — MAGNESIUM CITRATE PO SOLN: 1.0000 | Freq: Once | ORAL | Status: DC | PRN

## 2016-06-16 MED ORDER — DOCUSATE SODIUM 100 MG PO CAPS
100.0000 mg | ORAL_CAPSULE | Freq: Two times a day (BID) | ORAL | Status: DC
  Administered 2016-06-16 – 2016-06-17 (×2): 100 mg via ORAL
  Filled 2016-06-16 (×2): qty 1

## 2016-06-16 MED ORDER — CHLORHEXIDINE GLUCONATE 4 % EX LIQD: 60.0000 mL | Freq: Once | CUTANEOUS | Status: DC

## 2016-06-16 MED ORDER — TRANEXAMIC ACID 1000 MG/10ML IV SOLN
1000.0000 mg | INTRAVENOUS | Status: AC
  Administered 2016-06-16: 1000 mg via INTRAVENOUS
  Filled 2016-06-16: qty 1100

## 2016-06-16 MED ORDER — MENTHOL 3 MG MT LOZG: 1.0000 | LOZENGE | OROMUCOSAL | Status: DC | PRN

## 2016-06-16 MED ORDER — METHOCARBAMOL 500 MG PO TABS
500.0000 mg | ORAL_TABLET | Freq: Four times a day (QID) | ORAL | Status: DC | PRN
  Administered 2016-06-16 – 2016-06-17 (×2): 500 mg via ORAL
  Filled 2016-06-16 (×2): qty 1

## 2016-06-16 SURGICAL SUPPLY — 38 items
ADH SKN CLS APL DERMABOND .7 (GAUZE/BANDAGES/DRESSINGS) ×1
BAG DECANTER FOR FLEXI CONT (MISCELLANEOUS) IMPLANT
BAG SPEC THK2 15X12 ZIP CLS (MISCELLANEOUS)
BAG ZIPLOCK 12X15 (MISCELLANEOUS) IMPLANT
BLADE SAG 18X100X1.27 (BLADE) ×2 IMPLANT
CAPT HIP TOTAL 2 ×1 IMPLANT
CLOTH BEACON ORANGE TIMEOUT ST (SAFETY) ×2 IMPLANT
COVER PERINEAL POST (MISCELLANEOUS) ×2 IMPLANT
COVER SURGICAL LIGHT HANDLE (MISCELLANEOUS) ×2 IMPLANT
DERMABOND ADVANCED (GAUZE/BANDAGES/DRESSINGS) ×1
DERMABOND ADVANCED .7 DNX12 (GAUZE/BANDAGES/DRESSINGS) ×1 IMPLANT
DRAPE STERI IOBAN 125X83 (DRAPES) ×2 IMPLANT
DRAPE U-SHAPE 47X51 STRL (DRAPES) ×4 IMPLANT
DRESSING AQUACEL AG SP 3.5X10 (GAUZE/BANDAGES/DRESSINGS) ×1 IMPLANT
DRSG AQUACEL AG SP 3.5X10 (GAUZE/BANDAGES/DRESSINGS) ×2
DURAPREP 26ML APPLICATOR (WOUND CARE) ×2 IMPLANT
ELECT REM PT RETURN 15FT ADLT (MISCELLANEOUS) ×2 IMPLANT
GLOVE BIOGEL M STRL SZ7.5 (GLOVE) ×2 IMPLANT
GLOVE BIOGEL PI IND STRL 7.5 (GLOVE) ×1 IMPLANT
GLOVE BIOGEL PI IND STRL 8.5 (GLOVE) ×1 IMPLANT
GLOVE BIOGEL PI INDICATOR 7.5 (GLOVE) ×2
GLOVE BIOGEL PI INDICATOR 8.5 (GLOVE)
GLOVE ECLIPSE 8.0 STRL XLNG CF (GLOVE) ×3 IMPLANT
GLOVE ORTHO TXT STRL SZ7.5 (GLOVE) ×2 IMPLANT
GOWN STRL REUS W/TWL LRG LVL3 (GOWN DISPOSABLE) ×2 IMPLANT
GOWN STRL REUS W/TWL XL LVL3 (GOWN DISPOSABLE) ×2 IMPLANT
HOLDER FOLEY CATH W/STRAP (MISCELLANEOUS) ×2 IMPLANT
PACK ANTERIOR HIP CUSTOM (KITS) ×2 IMPLANT
SUT MNCRL AB 4-0 PS2 18 (SUTURE) ×2 IMPLANT
SUT STRATAFIX 0 PDS 27 VIOLET (SUTURE) ×2
SUT VIC AB 1 CT1 36 (SUTURE) ×6 IMPLANT
SUT VIC AB 2-0 CT1 27 (SUTURE) ×4
SUT VIC AB 2-0 CT1 TAPERPNT 27 (SUTURE) ×2 IMPLANT
SUTURE STRATFX 0 PDS 27 VIOLET (SUTURE) ×1 IMPLANT
TRAY FOLEY CATH 14FRSI W/METER (CATHETERS) ×1 IMPLANT
TRAY FOLEY W/METER SILVER 16FR (SET/KITS/TRAYS/PACK) IMPLANT
WATER STERILE IRR 1500ML POUR (IV SOLUTION) ×2 IMPLANT
YANKAUER SUCT BULB TIP 10FT TU (MISCELLANEOUS) ×1 IMPLANT

## 2016-06-16 NOTE — Anesthesia Postprocedure Evaluation (Signed)
Anesthesia Post Note  Patient: April Gordon  Procedure(s) Performed: Procedure(s) (LRB): RIGHT TOTAL HIP ARTHROPLASTY ANTERIOR APPROACH (Right)  Patient location during evaluation: PACU Anesthesia Type: Spinal Level of consciousness: awake and alert and oriented Pain management: pain level controlled Vital Signs Assessment: post-procedure vital signs reviewed and stable Respiratory status: spontaneous breathing, nonlabored ventilation, respiratory function stable and patient connected to nasal cannula oxygen Cardiovascular status: blood pressure returned to baseline and stable Postop Assessment: no headache, no backache, spinal receding, patient able to bend at knees and no signs of nausea or vomiting Anesthetic complications: no       Last Vitals:  Vitals:   06/16/16 1431 06/16/16 1530  BP: 134/70 118/67  Pulse:  (!) 58  Resp: 16 16  Temp: 36.4 C 36.3 C    Last Pain:  Vitals:   06/16/16 1530  TempSrc: Oral  PainSc:                  Reeve Turnley A.

## 2016-06-16 NOTE — Transfer of Care (Signed)
Immediate Anesthesia Transfer of Care Note  Patient: April Gordon  Procedure(s) Performed: Procedure(s): RIGHT TOTAL HIP ARTHROPLASTY ANTERIOR APPROACH (Right)  Patient Location: PACU  Anesthesia Type:Spinal  Level of Consciousness: sedated  Airway & Oxygen Therapy: Patient Spontanous Breathing and Patient connected to face mask oxygen  Post-op Assessment: Report given to RN and Post -op Vital signs reviewed and stable  Post vital signs: Reviewed and stable  Last Vitals:  Vitals:   06/16/16 0836  BP: (!) 143/77  Pulse: 79  Resp: 16  Temp: 36.8 C    Last Pain:  Vitals:   06/16/16 0848  TempSrc:   PainSc: 4          Complications: No apparent anesthesia complications

## 2016-06-16 NOTE — Op Note (Signed)
[name]                ACCOUNT NO.: [id number]      MEDICAL RECORD NO.: [id number]      FACILITY:  [facility]      PHYSICIAN:  Paralee Cancel D  DATE OF BIRTH:  08-Sep-1940     DATE OF PROCEDURE:  06/16/2016                                 OPERATIVE REPORT         PREOPERATIVE DIAGNOSIS: Right  hip osteoarthritis.      POSTOPERATIVE DIAGNOSIS:  Right hip osteoarthritis.      PROCEDURE:  Right total hip replacement through an anterior approach   utilizing DePuy THR system, component size 63mm pinnacle cup, a size 36+4 neutral   Altrex liner, a size 5 Hi Tri Lock stem with a 36+5 delta ceramic   ball.      SURGEON:  Pietro Cassis. Alvan Dame, M.D.      ASSISTANT:  Nehemiah Massed, PA-C     ANESTHESIA:  Spinal.      SPECIMENS:  None.      COMPLICATIONS:  None.      BLOOD LOSS:  600 cc     DRAINS:  None.      INDICATION OF THE PROCEDURE:  April Gordon is a 76 y.o. female who had   presented to office for evaluation of right hip pain.  Radiographs revealed   progressive degenerative changes with bone-on-bone   articulation to the  hip joint.  The patient had painful limited range of   motion significantly affecting their overall quality of life.  The patient was failing to    respond to conservative measures, and at this point was ready   to proceed with more definitive measures.  The patient has noted progressive   degenerative changes in his hip, progressive problems and dysfunction   with regarding the hip prior to surgery.  Consent was obtained for   benefit of pain relief.  Specific risk of infection, DVT, component   failure, dislocation, need for revision surgery, as well discussion of   the anterior versus posterior approach were reviewed.  Consent was   obtained for benefit of anterior pain relief through an anterior   approach.      PROCEDURE IN DETAIL:  The patient was brought to operative theater.   Once adequate anesthesia, preoperative  antibiotics, 2 gm Ancef, 1 gm of Tranexamic Acid, and 10 mg of Decadron administered.   The patient was positioned supine on the OSI Hanna table.  Once adequate   padding of boney process was carried out, we had predraped out the hip, and  used fluoroscopy to confirm orientation of the pelvis and position.      The right hip was then prepped and draped from proximal iliac crest to   mid thigh with shower curtain technique.      Time-out was performed identifying the patient, planned procedure, and   extremity.     An incision was then made 2 cm distal and lateral to the   anterior superior iliac spine extending over the orientation of the   tensor fascia lata muscle and sharp dissection was carried down to the   fascia of the muscle and protractor placed in the soft tissues.      The fascia was then incised.  The muscle belly was identified and swept   laterally and retractor placed along the superior neck.  Following   cauterization of the circumflex vessels and removing some pericapsular   fat, a second cobra retractor was placed on the inferior neck.  A third   retractor was placed on the anterior acetabulum after elevating the   anterior rectus.  A L-capsulotomy was along the line of the   superior neck to the trochanteric fossa, then extended proximally and   distally.  Tag sutures were placed and the retractors were then placed   intracapsular.  We then identified the trochanteric fossa and   orientation of my neck cut, confirmed this radiographically   and then made a neck osteotomy with the femur on traction.  The femoral   head was removed without difficulty or complication.  Traction was let   off and retractors were placed posterior and anterior around the   acetabulum.      The labrum and foveal tissue were debrided.  I began reaming with a 50mm   reamer and reamed up to 51 mm reamer with good bony bed preparation and a 52 mm   cup was chosen.  The final 52 mm Pinnacle  cup was then impacted under fluoroscopy  to confirm the depth of penetration and orientation with respect to   abduction.  A screw was placed followed by the hole eliminator.  The final   36+4 neutral Altrex liner was impacted with good visualized rim fit.  The cup was positioned anatomically within the acetabular portion of the pelvis.      At this point, the femur was rolled at 80 degrees.  Further capsule was   released off the inferior aspect of the femoral neck.  I then   released the superior capsule proximally.  The hook was placed laterally   along the femur and elevated manually and held in position with the bed   hook.  The leg was then extended and adducted with the leg rolled to 100   degrees of external rotation.  Once the proximal femur was fully   exposed, I used a box osteotome to set orientation.  I then began   broaching with the starting chili pepper broach and passed this by hand and then broached up to size 5.  With the size 5 broach in place I chose a high offset neck and did trial reductions.  The offset was appropriate, leg lengths   appeared to be equal best with the +5 head ball confirmed radiographically.   Given these findings, I went ahead and dislocated the hip, repositioned all   retractors and positioned the right hip in the extended and abducted position.  The final 5 Hi Tri Lock stem was   chosen and it was impacted down to the level of neck cut.  Based on this   and the trial reduction, a 36+5 delta ceramic ball was chosen and   impacted onto a clean and dry trunnion, and the hip was reduced.  The   hip had been irrigated throughout the case again at this point.  I did   reapproximate the superior capsular leaflet to the anterior leaflet   using #1 Vicryl.  The fascia of the   tensor fascia lata muscle was then reapproximated using #1 Vicryl and #0 Stratafix sutures.  The   remaining wound was closed with 2-0 Vicryl and running 4-0 Monocryl.   The hip was  cleaned, dried, and  dressed sterilely using Dermabond and   Aquacel dressing.  She was then brought   to recovery room in stable condition tolerating the procedure well.    Nehemiah Massed, PA-C was present for the entirety of the case involved from   preoperative positioning, perioperative retractor management, general   facilitation of the case, as well as primary wound closure as assistant.            Pietro Cassis Alvan Dame, M.D.        06/16/2016 11:43 AM

## 2016-06-16 NOTE — Anesthesia Preprocedure Evaluation (Signed)
Anesthesia Evaluation  Patient identified by MRN, date of birth, ID band Patient awake    Reviewed: Allergy & Precautions, NPO status , Patient's Chart, lab work & pertinent test results  Airway Mallampati: II  TM Distance: >3 FB Neck ROM: Full    Dental no notable dental hx. (+) Teeth Intact, Caps   Pulmonary former smoker,    Pulmonary exam normal breath sounds clear to auscultation       Cardiovascular hypertension, Pt. on medications Normal cardiovascular exam Rhythm:Regular Rate:Normal     Neuro/Psych negative neurological ROS  negative psych ROS   GI/Hepatic negative GI ROS, Neg liver ROS,   Endo/Other  negative endocrine ROS  Renal/GU negative Renal ROS  negative genitourinary   Musculoskeletal  (+) Arthritis , Osteoarthritis,  OA right knee   Abdominal (+) + obese,   Peds  Hematology negative hematology ROS (+)   Anesthesia Other Findings   Reproductive/Obstetrics                             Anesthesia Physical Anesthesia Plan  ASA: II  Anesthesia Plan: Spinal   Post-op Pain Management:    Induction:   Airway Management Planned: Natural Airway and Simple Face Mask  Additional Equipment:   Intra-op Plan:   Post-operative Plan:   Informed Consent: I have reviewed the patients History and Physical, chart, labs and discussed the procedure including the risks, benefits and alternatives for the proposed anesthesia with the patient or authorized representative who has indicated his/her understanding and acceptance.     Plan Discussed with: CRNA, Anesthesiologist and Surgeon  Anesthesia Plan Comments:         Anesthesia Quick Evaluation

## 2016-06-16 NOTE — Anesthesia Procedure Notes (Signed)
Spinal  Patient location during procedure: OR Start time: 06/16/2016 11:10 AM End time: 06/16/2016 11:18 AM Staffing Anesthesiologist: Josephine Igo Resident/CRNA: Talbot Grumbling Performed: resident/CRNA  Preanesthetic Checklist Completed: patient identified, site marked, surgical consent, pre-op evaluation, timeout performed, IV checked, risks and benefits discussed and monitors and equipment checked Spinal Block Patient position: sitting Prep: DuraPrep Patient monitoring: heart rate, cardiac monitor, continuous pulse ox and blood pressure Approach: midline Location: L3-4 Injection technique: single-shot Needle Needle type: Pencan  Needle gauge: 24 G Needle length: 9 cm Needle insertion depth: 8 cm Assessment Sensory level: T4 Events: paresthesia Additional Notes Transient Paresthesia. Patient tolerated procedure well. Adequate sensory level.

## 2016-06-16 NOTE — Discharge Instructions (Signed)

## 2016-06-16 NOTE — Interval H&P Note (Signed)
History and Physical Interval Note:  06/16/2016 10:08 AM  April Gordon  has presented today for surgery, with the diagnosis of Right hip osteoarthritis   The various methods of treatment have been discussed with the patient and family. After consideration of risks, benefits and other options for treatment, the patient has consented to  Procedure(s): RIGHT TOTAL HIP ARTHROPLASTY ANTERIOR APPROACH (Right) as a surgical intervention .  The patient's history has been reviewed, patient examined, no change in status, stable for surgery.  I have reviewed the patient's chart and labs.  Questions were answered to the patient's satisfaction.     [name]

## 2016-06-17 ENCOUNTER — Encounter (HOSPITAL_COMMUNITY): Payer: Self-pay | Admitting: Orthopedic Surgery

## 2016-06-17 LAB — CBC
HEMATOCRIT: 30.4 % — AB (ref 36.0–46.0)
Hemoglobin: 10.4 g/dL — ABNORMAL LOW (ref 12.0–15.0)
MCH: 30.8 pg (ref 26.0–34.0)
MCHC: 34.2 g/dL (ref 30.0–36.0)
MCV: 89.9 fL (ref 78.0–100.0)
Platelets: 208 10*3/uL (ref 150–400)
RBC: 3.38 MIL/uL — AB (ref 3.87–5.11)
RDW: 12.5 % (ref 11.5–15.5)
WBC: 12.9 10*3/uL — ABNORMAL HIGH (ref 4.0–10.5)

## 2016-06-17 LAB — BASIC METABOLIC PANEL
ANION GAP: 9 (ref 5–15)
BUN: 8 mg/dL (ref 6–20)
CHLORIDE: 102 mmol/L (ref 101–111)
CO2: 26 mmol/L (ref 22–32)
Calcium: 8.6 mg/dL — ABNORMAL LOW (ref 8.9–10.3)
Creatinine, Ser: 0.63 mg/dL (ref 0.44–1.00)
GFR calc Af Amer: >60 mL/min (ref >60)
GFR calc non Af Amer: >60 mL/min (ref >60)
Glucose, Bld: 134 mg/dL — ABNORMAL HIGH (ref 65–99)
POTASSIUM: 3 mmol/L — AB (ref 3.5–5.1)
Sodium: 137 mmol/L (ref 135–145)

## 2016-06-17 MED ORDER — POTASSIUM CHLORIDE CRYS ER 20 MEQ PO TBCR
40.0000 meq | EXTENDED_RELEASE_TABLET | Freq: Two times a day (BID) | ORAL | Status: DC
  Administered 2016-06-17: 40 meq via ORAL
  Filled 2016-06-17: qty 2

## 2016-06-17 NOTE — Progress Notes (Signed)
   06/17/16 1311  PT Visit Information  Last PT Received On 06/17/16--pt progressing very well, family present for pm PT session  Assistance Needed +1  History of Present Illness s/p R DA THA  Subjective Data  Patient Stated Goal return to independence  Precautions  Precautions Fall  Restrictions  Weight Bearing Restrictions No  Other Position/Activity Restrictions WBAT  Pain Assessment  Pain Assessment 0-10  Pain Score 1  Pain Location R hip  Pain Descriptors / Indicators Discomfort  Pain Intervention(s) Monitored during session  Cognition  Arousal/Alertness Awake/alert  Behavior During Therapy WFL for tasks assessed/performed  Overall Cognitive Status Within Functional Limits for tasks assessed  Bed Mobility  General bed mobility comments in chair  Transfers  Overall transfer level Needs assistance  Equipment used Rolling walker (2 wheeled);None  Transfers Sit to/from Stand  Sit to [employer] guard  General transfer comment pt self cues   Ambulation/Gait  Ambulation/Gait assistance Supervision;Min guard  Ambulation Distance (Feet) 200 Feet  Assistive device Rolling walker (2 wheeled);None;1 person hand held assist  Gait Pattern/deviations Step-to pattern;Trunk flexed  General Gait Details cues for trunk extension, upward gaze and step length; pt amb ~20' without AD/HHA  and min/guard for safety and balance  Total Joint Exercises  Quad Sets AROM;Strengthening;Both;10 reps  Heel Slides AAROM;AROM;10 reps;Right  Hip ABduction/ADduction AROM;Right;10 reps  PT - End of Session  Activity Tolerance Patient tolerated treatment well  Patient left in chair;with call bell/phone within reach;with family/visitor present  PT - Assessment/Plan  PT Plan Current plan remains appropriate  PT Visit Diagnosis Difficulty in walking, not elsewhere classified (R26.2);Pain  Pain - Right/Left Right  Pain - part of body Hip  PT Frequency (ACUTE ONLY) 7X/week  Follow Up Recommendations DC plan  and follow up therapy as arranged by surgeon  PT equipment None recommended by PT  AM-PAC PT "6 Clicks" Daily Activity Outcome Measure  Difficulty turning over in bed (including adjusting bedclothes, sheets and blankets)? 3  Difficulty moving from lying on back to sitting on the side of the bed?  3  Difficulty sitting down on and standing up from a chair with arms (e.g., wheelchair, bedside commode, etc,.)? 3  Help needed moving to and from a bed to chair (including a wheelchair)? 3  Help needed walking in hospital room? 3  Help needed climbing 3-5 steps with a railing?  2  6 Click Score 17  Mobility G Code  CK  PT Goal Progression  Progress towards PT goals Progressing toward goals  Acute Rehab PT Goals  PT Goal Formulation With patient/family  Time For Goal Achievement 06/20/16  Potential to Achieve Goals Good  PT Time Calculation  PT Start Time (ACUTE ONLY) 1127  PT Stop Time (ACUTE ONLY) 1147  PT Time Calculation (min) (ACUTE ONLY) 20 min  PT General Charges  $$ ACUTE PT VISIT 1 Procedure  PT Treatments  $Gait Training 8-22 mins

## 2016-06-17 NOTE — Progress Notes (Signed)
     Subjective: 1 Day Post-Op Procedure(s) (LRB): RIGHT TOTAL HIP ARTHROPLASTY ANTERIOR APPROACH (Right)   Patient reports pain as mild, pain controlled. No events throughout the night.  Feels that she is doing well and looking forward to getting better.    Objective:   VITALS:   Vitals:   06/17/16 0626 06/17/16 0859  BP: (!) 104/58 105/61  Pulse: (!) 55 (!) 57  Resp: 15 16  Temp: 97.7 F (36.5 C) 97.5 F (36.4 C)    Dorsiflexion/Plantar flexion intact Incision: dressing C/D/I No cellulitis present Compartment soft  LABS  Recent Labs  06/17/16 0434  HGB 10.4*  HCT 30.4*  WBC 12.9*  PLT 208     Recent Labs  06/17/16 0434  NA 137  K 3.0*  BUN 8  CREATININE 0.63  GLUCOSE 134*     Assessment/Plan: 1 Day Post-Op Procedure(s) (LRB): RIGHT TOTAL HIP ARTHROPLASTY ANTERIOR APPROACH (Right) Foley cath d/c'ed Advance diet Up with therapy D/C IV fluids Discharge home Follow up in 2 weeks at [facility]. Follow up with [facility],[place] [facility] in 2 weeks.  Contact information:  [facility] [address], [place] [phone]     Obese (BMI 30-39.9) Estimated body mass index is 31.15 kg/m as calculated from the following:   Height as of this encounter: 5\' 6"  (1.676 m).   Weight as of this encounter: 87.5 kg (193 lb). Patient also counseled that weight may inhibit the healing process Patient counseled that losing weight will help with future health issues   Hypokalemia Treated with oral potassium and will observe        [place] Pugh. Mary-Anne Polizzi   PAC  06/17/2016, 9:03 AM

## 2016-06-17 NOTE — Evaluation (Signed)
Occupational Therapy Evaluation Patient Details Name: April Gordon MRN: [id number] DOB: April 02, 1940 Today's Date: 06/17/2016    History of Present Illness s/p R DA THA   Clinical Impression   This 76 year old female was admitted for the above sx. All education was completed. No further OT is needed at this time    Follow Up Recommendations  No OT follow up;Supervision/Assistance - 24 hour (initially)    Equipment Recommendations  None recommended by OT    Recommendations for Other Services       Precautions / Restrictions Precautions Precautions: Fall Restrictions Weight Bearing Restrictions: No      Mobility Bed Mobility Overal bed mobility: Needs Assistance Bed Mobility: Supine to Sit     Supine to sit: Supervision     General bed mobility comments: extra time; HOB raised  Transfers Overall transfer level: Needs assistance Equipment used: Rolling walker (2 wheeled) Transfers: Sit to/from Stand Sit to Stand: Min guard         General transfer comment: for safety    Balance                                           ADL either performed or assessed with clinical judgement   ADL Overall ADL's : Needs assistance/impaired     Grooming: Wash/dry hands;Supervision/safety;Standing   Upper Body Bathing: Set up;Sitting   Lower Body Bathing: Minimal assistance;Sit to/from stand   Upper Body Dressing : Set up;Sitting   Lower Body Dressing: Moderate assistance;Sit to/from stand   Toilet Transfer: Min guard;Ambulation;BSC;RW   Toileting- Water quality scientist and Hygiene: Min guard;Sit to/from stand         General ADL Comments: reviewed tub readiness.  Pt states 3:1 will fit inside of tub. She has an elevated toilet seat without rails and plans to continue to use this. Vanity is next to her. She pre-cooked all of her food and will heat it.       Vision         Perception     Praxis      Pertinent Vitals/Pain Pain Assessment:  0-10 Pain Score: 1  Pain Location: R hip Pain Descriptors / Indicators: Sore Pain Intervention(s): Limited activity within patient's tolerance;Monitored during session;Premedicated before session;Repositioned;Ice applied     Hand Dominance     Extremity/Trunk Assessment Upper Extremity Assessment Upper Extremity Assessment: Overall WFL for tasks assessed       Cervical / Trunk Assessment Cervical / Trunk Assessment:  (difficulty extending trunk)   Communication Communication Communication: Prefers language other than English   Cognition Arousal/Alertness: Awake/alert Behavior During Therapy: WFL for tasks assessed/performed Overall Cognitive Status: Within Functional Limits for tasks assessed                                     General Comments       Exercises     Shoulder Instructions      Home Living Family/patient expects to be discharged to:: Private residence Living Arrangements: Alone                 Bathroom Shower/Tub: Teacher, early years/pre: Standard     Home Equipment: Toilet riser;Bedside commode (3:1 fits inside of tub)   Additional Comments: daughter there til Sunday; another local daughter and sister will  help out      Prior Functioning/Environment Level of Independence: Independent  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, who lived to be 100                OT Problem List:        OT Treatment/Interventions:      OT Goals(Current goals can be found in the care plan section) Acute Rehab OT Goals Patient Stated Goal: return to independence OT Goal Formulation: Patient unable to participate in goal setting  OT Frequency:     Barriers to D/C:            Co-evaluation              AM-PAC PT "6 Clicks" Daily Activity     Outcome Measure Help from another person eating meals?: None Help from another person taking care of personal grooming?: A Little Help from  another person toileting, which includes using toliet, bedpan, or urinal?: A Little Help from another person bathing (including washing, rinsing, drying)?: A Little Help from another person to put on and taking off regular upper body clothing?: A Little Help from another person to put on and taking off regular lower body clothing?: A Lot 6 Click Score: 18   End of Session    Activity Tolerance: Patient tolerated treatment well Patient left: in chair;with call bell/phone within reach  OT Visit Diagnosis: Pain Pain - Right/Left: Right Pain - part of body: Hip                Time: 4403-4742 OT Time Calculation (min): 20 min Charges:  OT General Charges $OT Visit: 1 Procedure OT Evaluation $OT Eval Low Complexity: 1 Procedure G-Codes:     [place], OTR/L [phone] 06/17/2016  Taegen Lennox 06/17/2016, 10:44 AM

## 2016-06-17 NOTE — Progress Notes (Signed)
Discharge planning, no HH needs identified. Has DME. [phone] 

## 2016-06-17 NOTE — Evaluation (Signed)
Physical Therapy Evaluation Patient Details Name: April Gordon MRN: [id number] DOB: 1940/02/10 Today's Date: 06/17/2016   History of Present Illness  s/p R DA THA  Clinical Impression  Pt is s/p THA resulting in the deficits listed below (see PT Problem List). * Pt will benefit from skilled PT to increase their independence and safety with mobility to allow discharge to the venue listed below.      Follow Up Recommendations DC plan and follow up therapy as arranged by surgeon    Equipment Recommendations  None recommended by PT    Recommendations for Other Services       Precautions / Restrictions Precautions Precautions: Fall Restrictions Weight Bearing Restrictions: No Other Position/Activity Restrictions: WBAT      Mobility  Bed Mobility Overal bed mobility: Needs Assistance Bed Mobility: Supine to Sit     Supine to sit: Supervision     General bed mobility comments: in chair  Transfers Overall transfer level: Needs assistance Equipment used: Rolling walker (2 wheeled) Transfers: Sit to/from Stand Sit to Stand: Min guard         General transfer comment: for safety and balance with transition to RW  Ambulation/Gait Ambulation/Gait assistance: Min assist;Min guard Ambulation Distance (Feet): 160 Feet Assistive device: Rolling walker (2 wheeled) Gait Pattern/deviations: Step-to pattern;Trunk flexed     General Gait Details: cues for trunk, upward gaze and step length  Stairs            Wheelchair Mobility    Modified Rankin (Stroke Patients Only)       Balance                                             Pertinent Vitals/Pain Pain Assessment: 0-10 Pain Score: 1  Pain Location: R hip Pain Descriptors / Indicators: Discomfort Pain Intervention(s): Limited activity within patient's tolerance;Monitored during session;Premedicated before session    Home Living Family/patient expects to be discharged to:: Private  residence Living Arrangements: Alone Available Help at Discharge: Family;Available 24 hours/day Type of Home: House Home Access: Stairs to enter   [employer] of Steps: 1 Home Layout: One level;Laundry or work area in [employer]: Toilet riser;Bedside commode;Walker - 2 wheels;Kasandra Knudsen - single point Additional Comments: daughter there til Sunday; another local daughter and sister will help out--pt prefers not to have family there anymore than necessary    Prior Function Level of Independence: Independent;Independent with assistive device(s)         Comments: amb with cane prior to adm     Hand Dominance        Extremity/Trunk Assessment   Upper Extremity Assessment Upper Extremity Assessment: Overall WFL for tasks assessed    Lower Extremity Assessment Lower Extremity Assessment: RLE deficits/detail RLE Deficits / Details: ankle WFL; knee extension and hip flexion 2+/5, limited by postop weakness    Cervical / Trunk Assessment Cervical / Trunk Assessment:  (difficulty extending trunk)  Communication   Communication: No difficulties  Cognition Arousal/Alertness: Awake/alert Behavior During Therapy: WFL for tasks assessed/performed Overall Cognitive Status: Within Functional Limits for tasks assessed                                        General Comments      Exercises Total Joint Exercises Ankle  Circles/Pumps: AROM;Both;10 reps Quad Sets: AROM;Both;5 reps   Assessment/Plan    PT Assessment Patient needs continued PT services  PT Problem List Decreased strength;Decreased range of motion;Decreased mobility;Pain;Decreased knowledge of use of DME       PT Treatment Interventions DME instruction;Gait training;Stair training;Therapeutic exercise;Therapeutic activities;Patient/family education    PT Goals (Current goals can be found in the Care Plan section)  Acute Rehab PT Goals Patient Stated Goal: return to  independence PT Goal Formulation: With patient/family Time For Goal Achievement: 06/20/16 Potential to Achieve Goals: Good    Frequency     Barriers to discharge        Co-evaluation               AM-PAC PT "6 Clicks" Daily Activity  Outcome Measure Difficulty turning over in bed (including adjusting bedclothes, sheets and blankets)?: A Little Difficulty moving from lying on back to sitting on the side of the bed? : A Little Difficulty sitting down on and standing up from a chair with arms (e.g., wheelchair, bedside commode, etc,.)?: A Little Help needed moving to and from a bed to chair (including a wheelchair)?: A Little Help needed walking in hospital room?: A Little Help needed climbing 3-5 steps with a railing? : A Lot 6 Click Score: 17    End of Session Equipment Utilized During Treatment: Gait belt Activity Tolerance: Patient tolerated treatment well Patient left: in chair;with call bell/phone within reach   PT Visit Diagnosis: Difficulty in walking, not elsewhere classified (R26.2);Pain Pain - Right/Left: Right Pain - part of body: Hip    Time: 2060-1561 PT Time Calculation (min) (ACUTE ONLY): 20 min   Charges:   PT Evaluation $PT Eval Low Complexity: 1 Procedure     PT G CodesKenyon Ana, PT Pager: [phone] 06/17/2016   [facility] 06/17/2016, 1:12 PM

## 2016-06-20 NOTE — Discharge Summary (Signed)
Physician Discharge Summary  Patient ID: Lovelle Deitrick MRN: [id number] DOB/AGE: 09/30/1940 76 y.o.  Admit date: 06/16/2016 Discharge date: 06/17/2016   Procedures:  Procedure(s) (LRB): RIGHT TOTAL HIP ARTHROPLASTY ANTERIOR APPROACH (Right)  Attending Physician:  Dr. Paralee Cancel   Admission Diagnoses:   Right hip primary OA / pain  Discharge Diagnoses:  Principal Problem:   S/P right THA, AA Active Problems:   HYPOKALEMIA   OBESITY  Past Medical History:  Diagnosis Date  . Arthritis   . HTN (hypertension)     HPI:    April Gordon, 76 y.o. female, has a history of pain and functional disability in the right hip(s) due to arthritis and patient has failed non-surgical conservative treatments for greater than 12 weeks to include NSAID's and/or analgesics, use of assistive devices and activity modification.  Onset of symptoms was gradual starting years ago with gradually worsening course since that time.The patient noted no past surgery on the right hip(s).  Patient currently rates pain in the right hip at 9 out of 10 with activity. Patient has worsening of pain with activity and weight bearing, trendelenberg gait, pain that interfers with activities of daily living and pain with passive range of motion. Patient has evidence of periarticular osteophytes and joint subluxation by imaging studies. This condition presents safety issues increasing the risk of falls. There is no current active infection.   Risks, benefits and expectations were discussed with the patient.  Risks including but not limited to the risk of anesthesia, blood clots, nerve damage, blood vessel damage, failure of the prosthesis, infection and up to and including death.  Patient understand the risks, benefits and expectations and wishes to proceed with surgery.   PCP: Celene Squibb, MD   Discharged Condition: good  Hospital Course:  Patient underwent the above stated procedure on 06/16/2016. Patient tolerated the procedure  well and brought to the recovery room in good condition and subsequently to the floor.  POD #1 BP: 105/61 ; Pulse: 57 ; Temp: 97.5 F (36.4 C) ; Resp: 16 Patient reports pain as mild, pain controlled. No events throughout the night.  Feels that she is doing well and looking forward to getting better. Ready to be discharged home. Dorsiflexion/plantar flexion intact, incision: dressing C/D/I, no cellulitis present and compartment soft.   LABS  Basename    HGB     10.4  HCT     30.4    Discharge Exam: General appearance: alert, cooperative and no distress Extremities: Homans sign is negative, no sign of DVT, no edema, redness or tenderness in the calves or thighs and no ulcers, gangrene or trophic changes  Disposition: Home with follow up in 2 weeks   Follow-up Information    Paralee Cancel, MD. Schedule an appointment as soon as possible for a visit in 2 week(s).   Specialty:  Orthopedic Surgery Contact information: [address] [place] [address] [phone]           Discharge Instructions    Call MD / Call 911    Complete by:  As directed    If you experience chest pain or shortness of breath, CALL 911 and be transported to the hospital emergency room.  If you develope a fever above 101 F, pus (white drainage) or increased drainage or redness at the wound, or calf pain, call your surgeon's office.   Change dressing    Complete by:  As directed    Maintain surgical dressing until follow up  in the clinic. If the edges start to pull up, may reinforce with tape. If the dressing is no longer working, may remove and cover with gauze and tape, but must keep the area dry and clean.  Call with any questions or concerns.   Constipation Prevention    Complete by:  As directed    Drink plenty of fluids.  Prune juice may be helpful.  You may use a stool softener, such as Colace (over the counter) 100 mg twice a day.  Use MiraLax (over the counter) for constipation  as needed.   Diet - low sodium heart healthy    Complete by:  As directed    Discharge instructions    Complete by:  As directed    Maintain surgical dressing until follow up in the clinic. If the edges start to pull up, may reinforce with tape. If the dressing is no longer working, may remove and cover with gauze and tape, but must keep the area dry and clean.  Follow up in 2 weeks at [facility]. Call with any questions or concerns.   Increase activity slowly as tolerated    Complete by:  As directed    Weight bearing as tolerated with assist device (walker, cane, etc) as directed, use it as long as suggested by your surgeon or therapist, typically at least 4-6 weeks.   TED hose    Complete by:  As directed    Use stockings (TED hose) for 2 weeks on both leg(s).  You may remove them at night for sleeping.      Allergies as of 06/17/2016   No Known Allergies     Medication List    TAKE these medications   aspirin 81 MG chewable tablet Chew 1 tablet (81 mg total) by mouth 2 (two) times daily. Take for 4 weeks.   cholecalciferol 400 units Tabs tablet Commonly known as:  VITAMIN D Take 400 Units by mouth daily.   docusate sodium 100 MG capsule Commonly known as:  COLACE Take 1 capsule (100 mg total) by mouth 2 (two) times daily.   ferrous sulfate 325 (65 FE) MG tablet Commonly known as:  FERROUSUL Take 1 tablet (325 mg total) by mouth 3 (three) times daily with meals.   hydrochlorothiazide 50 MG tablet Commonly known as:  HYDRODIURIL Take 50 mg by mouth daily.   HYDROcodone-acetaminophen 7.5-325 MG tablet Commonly known as:  NORCO Take 1-2 tablets by mouth every 4 (four) hours as needed for moderate pain or severe pain.   methocarbamol 500 MG tablet Commonly known as:  ROBAXIN Take 1 tablet (500 mg total) by mouth every 6 (six) hours as needed for muscle spasms.   polyethylene glycol packet Commonly known as:  MIRALAX / GLYCOLAX Take 17 g by mouth 2 (two)  times daily.   verapamil 240 MG CR tablet Commonly known as:  CALAN-SR Take 240 mg by mouth daily with lunch.   vitamin B-12 500 MCG tablet Commonly known as:  CYANOCOBALAMIN Take 500 mcg by mouth daily.   vitamin C 500 MG tablet Commonly known as:  ASCORBIC ACID Take 500 mg by mouth daily.   vitamin E 400 UNIT capsule Take 400 Units by mouth daily.        Signed: West Pugh. Nashley Cordoba   PA-C  06/20/2016, 8:42 AM

## 2016-07-02 ENCOUNTER — Encounter (HOSPITAL_COMMUNITY): Payer: Self-pay

## 2016-07-02 ENCOUNTER — Ambulatory Visit (HOSPITAL_COMMUNITY): Payer: Medicare HMO | Attending: Physician Assistant

## 2016-07-08 ENCOUNTER — Ambulatory Visit (HOSPITAL_COMMUNITY): Payer: Medicare HMO | Admitting: Physical Therapy

## 2016-07-13 ENCOUNTER — Encounter (HOSPITAL_COMMUNITY): Payer: Medicare HMO | Admitting: Physical Therapy

## 2016-07-15 ENCOUNTER — Ambulatory Visit (HOSPITAL_COMMUNITY): Payer: Medicare HMO

## 2016-07-20 ENCOUNTER — Ambulatory Visit (HOSPITAL_COMMUNITY): Payer: Medicare HMO | Admitting: Physical Therapy

## 2016-07-23 ENCOUNTER — Encounter (HOSPITAL_COMMUNITY): Payer: Medicare HMO

## 2016-07-27 ENCOUNTER — Encounter (HOSPITAL_COMMUNITY): Payer: Medicare HMO | Admitting: Physical Therapy

## 2016-07-29 ENCOUNTER — Encounter (HOSPITAL_COMMUNITY): Payer: Medicare HMO | Admitting: Physical Therapy

## 2016-07-29 DIAGNOSIS — Z96641 Presence of right artificial hip joint: Secondary | ICD-10-CM | POA: Diagnosis not present

## 2016-08-06 DIAGNOSIS — Z1231 Encounter for screening mammogram for malignant neoplasm of breast: Secondary | ICD-10-CM | POA: Diagnosis not present

## 2016-09-09 DIAGNOSIS — Z471 Aftercare following joint replacement surgery: Secondary | ICD-10-CM | POA: Diagnosis not present

## 2016-09-09 DIAGNOSIS — Z96641 Presence of right artificial hip joint: Secondary | ICD-10-CM | POA: Diagnosis not present

## 2016-09-15 DIAGNOSIS — Z96641 Presence of right artificial hip joint: Secondary | ICD-10-CM | POA: Diagnosis not present

## 2016-09-15 DIAGNOSIS — M159 Polyosteoarthritis, unspecified: Secondary | ICD-10-CM | POA: Diagnosis not present

## 2016-09-15 DIAGNOSIS — M542 Cervicalgia: Secondary | ICD-10-CM | POA: Diagnosis not present

## 2016-09-15 DIAGNOSIS — Z6828 Body mass index (BMI) 28.0-28.9, adult: Secondary | ICD-10-CM | POA: Diagnosis not present

## 2016-09-22 DIAGNOSIS — I10 Essential (primary) hypertension: Secondary | ICD-10-CM | POA: Diagnosis not present

## 2016-09-24 DIAGNOSIS — I10 Essential (primary) hypertension: Secondary | ICD-10-CM | POA: Diagnosis not present

## 2016-09-24 DIAGNOSIS — M542 Cervicalgia: Secondary | ICD-10-CM | POA: Diagnosis not present

## 2016-09-24 DIAGNOSIS — Z6829 Body mass index (BMI) 29.0-29.9, adult: Secondary | ICD-10-CM | POA: Diagnosis not present

## 2016-09-24 DIAGNOSIS — R748 Abnormal levels of other serum enzymes: Secondary | ICD-10-CM | POA: Diagnosis not present

## 2016-09-24 DIAGNOSIS — M169 Osteoarthritis of hip, unspecified: Secondary | ICD-10-CM | POA: Diagnosis not present

## 2016-11-04 DIAGNOSIS — Z23 Encounter for immunization: Secondary | ICD-10-CM | POA: Diagnosis not present

## 2016-11-05 DIAGNOSIS — M25552 Pain in left hip: Secondary | ICD-10-CM | POA: Diagnosis not present

## 2016-11-05 DIAGNOSIS — M25562 Pain in left knee: Secondary | ICD-10-CM | POA: Diagnosis not present

## 2016-11-05 DIAGNOSIS — R609 Edema, unspecified: Secondary | ICD-10-CM | POA: Diagnosis not present

## 2016-11-19 DIAGNOSIS — M25562 Pain in left knee: Secondary | ICD-10-CM | POA: Diagnosis not present

## 2016-12-02 DIAGNOSIS — G8929 Other chronic pain: Secondary | ICD-10-CM | POA: Diagnosis not present

## 2016-12-02 DIAGNOSIS — M25562 Pain in left knee: Secondary | ICD-10-CM | POA: Diagnosis not present

## 2016-12-02 DIAGNOSIS — M25552 Pain in left hip: Secondary | ICD-10-CM | POA: Diagnosis not present

## 2016-12-02 DIAGNOSIS — M1712 Unilateral primary osteoarthritis, left knee: Secondary | ICD-10-CM | POA: Diagnosis not present

## 2017-01-18 DIAGNOSIS — G8929 Other chronic pain: Secondary | ICD-10-CM | POA: Diagnosis not present

## 2017-01-18 DIAGNOSIS — M25561 Pain in right knee: Secondary | ICD-10-CM | POA: Diagnosis not present

## 2017-01-18 DIAGNOSIS — M25562 Pain in left knee: Secondary | ICD-10-CM | POA: Diagnosis not present

## 2017-01-18 DIAGNOSIS — M5136 Other intervertebral disc degeneration, lumbar region: Secondary | ICD-10-CM | POA: Diagnosis not present

## 2017-01-18 DIAGNOSIS — M1712 Unilateral primary osteoarthritis, left knee: Secondary | ICD-10-CM | POA: Diagnosis not present

## 2017-01-18 DIAGNOSIS — S39012D Strain of muscle, fascia and tendon of lower back, subsequent encounter: Secondary | ICD-10-CM | POA: Diagnosis not present

## 2017-03-24 DIAGNOSIS — M25562 Pain in left knee: Secondary | ICD-10-CM | POA: Diagnosis not present

## 2017-03-24 DIAGNOSIS — M1712 Unilateral primary osteoarthritis, left knee: Secondary | ICD-10-CM | POA: Diagnosis not present

## 2017-03-24 DIAGNOSIS — M25561 Pain in right knee: Secondary | ICD-10-CM | POA: Diagnosis not present

## 2017-04-08 ENCOUNTER — Encounter (HOSPITAL_COMMUNITY): Payer: Self-pay | Admitting: *Deleted

## 2017-04-21 ENCOUNTER — Encounter (HOSPITAL_COMMUNITY): Payer: Self-pay | Admitting: *Deleted

## 2017-04-27 ENCOUNTER — Inpatient Hospital Stay: Admit: 2017-04-27 | Payer: Medicare HMO | Admitting: Orthopedic Surgery

## 2017-04-27 SURGERY — ARTHROPLASTY, KNEE, TOTAL
Anesthesia: Spinal | Laterality: Left

## 2017-04-29 ENCOUNTER — Encounter (HOSPITAL_COMMUNITY): Payer: Self-pay

## 2017-04-29 DIAGNOSIS — Z01818 Encounter for other preprocedural examination: Secondary | ICD-10-CM | POA: Diagnosis not present

## 2017-04-29 DIAGNOSIS — M25562 Pain in left knee: Secondary | ICD-10-CM | POA: Diagnosis not present

## 2017-04-29 NOTE — Patient Instructions (Addendum)
April Gordon  04/29/2017   Your procedure is scheduled on: 05-11-17  Report to [facility] Main  Entrance              Report to admitting at      0730 AM    Call this number if you have problems the morning of surgery [phone]    Remember: Do not eat food or drink liquids :After Midnight.     Take these medicines the morning of surgery with A SIP OF WATER: verapamil                                You may not have any metal on your body including hair pins and              piercings  Do not wear jewelry, make-up, lotions, powders or perfumes, deodorant             Do not wear nail polish.  Do not shave  48 hours prior to surgery.     Do not bring valuables to the hospital. Rule.  Contacts, dentures or bridgework may not be worn into surgery.  Leave suitcase in the car. After surgery it may be brought to your room.               Please read over the following fact sheets you were given: _____________________________________________________________________           [facility] - Preparing for Surgery Before surgery, you can play an important role.  Because skin is not sterile, your skin needs to be as free of germs as possible.  You can reduce the number of germs on your skin by washing with CHG (chlorahexidine gluconate) soap before surgery.  CHG is an antiseptic cleaner which kills germs and bonds with the skin to continue killing germs even after washing. Please DO NOT use if you have an allergy to CHG or antibacterial soaps.  If your skin becomes reddened/irritated stop using the CHG and inform your nurse when you arrive at Short Stay. Do not shave (including legs and underarms) for at least 48 hours prior to the first CHG shower.  You may shave your face/neck. Please follow these instructions carefully:  1.  Shower with CHG Soap the night before surgery and the  morning of Surgery.  2.  If you  choose to wash your hair, wash your hair first as usual with your  normal  shampoo.  3.  After you shampoo, rinse your hair and body thoroughly to remove the  shampoo.                           4.  Use CHG as you would any other liquid soap.  You can apply chg directly  to the skin and wash                       Gently with a scrungie or clean washcloth.  5.  Apply the CHG Soap to your body ONLY FROM THE NECK DOWN.   Do not use on face/ open  Wound or open sores. Avoid contact with eyes, ears mouth and genitals (private parts).                       Wash face,  Genitals (private parts) with your normal soap.             6.  Wash thoroughly, paying special attention to the area where your surgery  will be performed.  7.  Thoroughly rinse your body with warm water from the neck down.  8.  DO NOT shower/wash with your normal soap after using and rinsing off  the CHG Soap.                9.  Pat yourself dry with a clean towel.            10.  Wear clean pajamas.            11.  Place clean sheets on your bed the night of your first shower and do not  sleep with pets. Day of Surgery : Do not apply any lotions/deodorants the morning of surgery.  Please wear clean clothes to the hospital/surgery center.  FAILURE TO FOLLOW THESE INSTRUCTIONS MAY RESULT IN THE CANCELLATION OF YOUR SURGERY PATIENT SIGNATURE_________________________________  NURSE SIGNATURE__________________________________  ________________________________________________________________________  WHAT IS A BLOOD TRANSFUSION? Blood Transfusion Information  A transfusion is the replacement of blood or some of its parts. Blood is made up of multiple cells which provide different functions.  Red blood cells carry oxygen and are used for blood loss replacement.  White blood cells fight against infection.  Platelets control bleeding.  Plasma helps clot blood.  Other blood products are available for  specialized needs, such as hemophilia or other clotting disorders. BEFORE THE TRANSFUSION  Who gives blood for transfusions?   Healthy volunteers who are fully evaluated to make sure their blood is safe. This is blood bank blood. Transfusion therapy is the safest it has ever been in the practice of medicine. Before blood is taken from a donor, a complete history is taken to make sure that person has no history of diseases nor engages in risky social behavior (examples are intravenous drug use or sexual activity with multiple partners). The donor's travel history is screened to minimize risk of transmitting infections, such as malaria. The donated blood is tested for signs of infectious diseases, such as HIV and hepatitis. The blood is then tested to be sure it is compatible with you in order to minimize the chance of a transfusion reaction. If you or a relative donates blood, this is often done in anticipation of surgery and is not appropriate for emergency situations. It takes many days to process the donated blood. RISKS AND COMPLICATIONS Although transfusion therapy is very safe and saves many lives, the main dangers of transfusion include:   Getting an infectious disease.  Developing a transfusion reaction. This is an allergic reaction to something in the blood you were given. Every precaution is taken to prevent this. The decision to have a blood transfusion has been considered carefully by your caregiver before blood is given. Blood is not given unless the benefits outweigh the risks. AFTER THE TRANSFUSION  Right after receiving a blood transfusion, you will usually feel much better and more energetic. This is especially true if your red blood cells have gotten low (anemic). The transfusion raises the level of the red blood cells which carry oxygen, and this usually causes an energy increase.  The  nurse administering the transfusion will monitor you carefully for complications. HOME CARE  INSTRUCTIONS  No special instructions are needed after a transfusion. You may find your energy is better. Speak with your caregiver about any limitations on activity for underlying diseases you may have. SEEK MEDICAL CARE IF:   Your condition is not improving after your transfusion.  You develop redness or irritation at the intravenous (IV) site. SEEK IMMEDIATE MEDICAL CARE IF:  Any of the following symptoms occur over the next 12 hours:  Shaking chills.  You have a temperature by mouth above 102 F (38.9 C), not controlled by medicine.  Chest, back, or muscle pain.  People around you feel you are not acting correctly or are confused.  Shortness of breath or difficulty breathing.  Dizziness and fainting.  You get a rash or develop hives.  You have a decrease in urine output.  Your urine turns a dark color or changes to pink, red, or brown. Any of the following symptoms occur over the next 10 days:  You have a temperature by mouth above 102 F (38.9 C), not controlled by medicine.  Shortness of breath.  Weakness after normal activity.  The white part of the eye turns yellow (jaundice).  You have a decrease in the amount of urine or are urinating less often.  Your urine turns a dark color or changes to pink, red, or brown. Document Released: 01/03/2000 Document Revised: 03/30/2011 Document Reviewed: 08/22/2007 ExitCare Patient Information 2014 [place].  _______________________________________________________________________  Incentive Spirometer  An incentive spirometer is a tool that can help keep your lungs clear and active. This tool measures how well you are filling your lungs with each breath. Taking long deep breaths may help reverse or decrease the chance of developing breathing (pulmonary) problems (especially infection) following:  A long period of time when you are unable to move or be active. BEFORE THE PROCEDURE   If the spirometer includes an  indicator to show your best effort, your nurse or respiratory therapist will set it to a desired goal.  If possible, sit up straight or lean slightly forward. Try not to slouch.  Hold the incentive spirometer in an upright position. INSTRUCTIONS FOR USE  1. Sit on the edge of your bed if possible, or sit up as far as you can in bed or on a chair. 2. Hold the incentive spirometer in an upright position. 3. Breathe out normally. 4. Place the mouthpiece in your mouth and seal your lips tightly around it. 5. Breathe in slowly and as deeply as possible, raising the piston or the ball toward the top of the column. 6. Hold your breath for 3-5 seconds or for as long as possible. Allow the piston or ball to fall to the bottom of the column. 7. Remove the mouthpiece from your mouth and breathe out normally. 8. Rest for a few seconds and repeat Steps 1 through 7 at least 10 times every 1-2 hours when you are awake. Take your time and take a few normal breaths between deep breaths. 9. The spirometer may include an indicator to show your best effort. Use the indicator as a goal to work toward during each repetition. 10. After each set of 10 deep breaths, practice coughing to be sure your lungs are clear. If you have an incision (the cut made at the time of surgery), support your incision when coughing by placing a pillow or rolled up towels firmly against it. Once you are able to get  out of bed, walk around indoors and cough well. You may stop using the incentive spirometer when instructed by your caregiver.  RISKS AND COMPLICATIONS  Take your time so you do not get dizzy or light-headed.  If you are in pain, you may need to take or ask for pain medication before doing incentive spirometry. It is harder to take a deep breath if you are having pain. AFTER USE  Rest and breathe slowly and easily.  It can be helpful to keep track of a log of your progress. Your caregiver can provide you with a simple table  to help with this. If you are using the spirometer at home, follow these instructions: [place] IF:   You are having difficultly using the spirometer.  You have trouble using the spirometer as often as instructed.  Your pain medication is not giving enough relief while using the spirometer.  You develop fever of 100.5 F (38.1 C) or higher. SEEK IMMEDIATE MEDICAL CARE IF:   You cough up bloody sputum that had not been present before.  You develop fever of 102 F (38.9 C) or greater.  You develop worsening pain at or near the incision site. MAKE SURE YOU:   Understand these instructions.  Will watch your condition.  Will get help right away if you are not doing well or get worse. Document Released: 05/18/2006 Document Revised: 03/30/2011 Document Reviewed: 07/19/2006 [facility] Patient Information 2014 [place], [place].   ________________________________________________________________________

## 2017-05-04 ENCOUNTER — Encounter (HOSPITAL_COMMUNITY): Payer: Self-pay

## 2017-05-04 ENCOUNTER — Other Ambulatory Visit: Payer: Self-pay

## 2017-05-04 ENCOUNTER — Encounter (HOSPITAL_COMMUNITY)
Admission: RE | Admit: 2017-05-04 | Discharge: 2017-05-04 | Disposition: A | Discharge status: Home / Self Care | Payer: Medicare HMO | Source: Ambulatory Visit | Attending: Orthopedic Surgery | Admitting: Orthopedic Surgery

## 2017-05-04 DIAGNOSIS — Z0183 Encounter for blood typing: Secondary | ICD-10-CM | POA: Insufficient documentation

## 2017-05-04 DIAGNOSIS — M1712 Unilateral primary osteoarthritis, left knee: Secondary | ICD-10-CM | POA: Diagnosis not present

## 2017-05-04 DIAGNOSIS — Z01812 Encounter for preprocedural laboratory examination: Secondary | ICD-10-CM | POA: Insufficient documentation

## 2017-05-04 LAB — CBC
HCT: 41.2 % (ref 36.0–46.0)
HEMOGLOBIN: 13.4 g/dL (ref 12.0–15.0)
MCH: 30.3 pg (ref 26.0–34.0)
MCHC: 32.5 g/dL (ref 30.0–36.0)
MCV: 93.2 fL (ref 78.0–100.0)
PLATELETS: 293 10*3/uL (ref 150–400)
RBC: 4.42 MIL/uL (ref 3.87–5.11)
RDW: 12.6 % (ref 11.5–15.5)
WBC: 9.5 10*3/uL (ref 4.0–10.5)

## 2017-05-04 LAB — SURGICAL PCR SCREEN
MRSA, PCR: NEGATIVE
Staphylococcus aureus: NEGATIVE

## 2017-05-04 LAB — BASIC METABOLIC PANEL
ANION GAP: 12 (ref 5–15)
BUN: 15 mg/dL (ref 6–20)
CO2: 27 mmol/L (ref 22–32)
Calcium: 9.6 mg/dL (ref 8.9–10.3)
Chloride: 102 mmol/L (ref 101–111)
Creatinine, Ser: 0.69 mg/dL (ref 0.44–1.00)
GFR calc Af Amer: >60 mL/min (ref >60)
Glucose, Bld: 90 mg/dL (ref 65–99)
POTASSIUM: 4 mmol/L (ref 3.5–5.1)
SODIUM: 141 mmol/L (ref 135–145)

## 2017-05-04 NOTE — Progress Notes (Signed)
06-05-16 ekg in epic

## 2017-05-04 NOTE — Progress Notes (Signed)
04-29-17 clearance Dr. Nevada Crane chart

## 2017-05-05 NOTE — Progress Notes (Signed)
LVM with time change for surgery pt. To arrive at 1030 am instead of 930 am.

## 2017-05-09 NOTE — H&P (Signed)
TOTAL KNEE ADMISSION H&P  Patient is being admitted for left total knee arthroplasty.  Subjective:  Chief Complaint:   Left knee primary OA / pain  HPI: April Gordon, 77 y.o. female, has a history of pain and functional disability in the left knee due to arthritis and has failed non-surgical conservative treatments for greater than 12 weeks to includeNSAID's and/or analgesics, corticosteriod injections, use of assistive devices and activity modification.  Onset of symptoms was gradual, starting 2+ years ago with gradually worsening course since that time. The patient noted no past surgery on the left knee(s).  Patient currently rates pain in the left knee(s) at 1 out of 10 with activity, more complaints of stiffness. Patient has worsening of pain with activity and weight bearing, pain that interferes with activities of daily living, pain with passive range of motion, crepitus and joint swelling.  Patient has evidence of periarticular osteophytes and joint space narrowing by imaging studies.  There is no active infection.  Risks, benefits and expectations were discussed with the patient.  Risks including but not limited to the risk of anesthesia, blood clots, nerve damage, blood vessel damage, failure of the prosthesis, infection and up to and including death.  Patient understand the risks, benefits and expectations and wishes to proceed with surgery.   PCP: Celene Squibb, MD  D/C Plans:       Home   Post-op Meds:       No Rx given  Tranexamic Acid:      To be given - IV   Decadron:      Is to be given  FYI:      ASA  Oxycodone (liver count up, avoid APAP)  DME:   Pt already has equipment  PT:   OPPT rx given   Patient Active Problem List   Diagnosis Date Noted  . 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 Effusion of knee joint, left 07/06/2011  . HYPOKALEMIA 07/18/2008  . OSTEOPENIA 02/18/2007  . ALKALINE PHOSPHATASE, ELEVATED 01/07/2007  . OBESITY 11/30/2006  . HYPERTENSION 11/30/2006  .  ARTHRITIS 11/30/2006   Past Medical History:  Diagnosis Date  . Arthritis   . HTN (hypertension)     Past Surgical History:  Procedure Laterality Date  . JOINT REPLACEMENT     Left total knee Dr. Alvan Dame 05-11-17  . TOTAL HIP ARTHROPLASTY Right 06/16/2016   Procedure: RIGHT TOTAL HIP ARTHROPLASTY ANTERIOR APPROACH;  Surgeon: Paralee Cancel, MD;  Location: [facility];  Service: Orthopedics;  Laterality: Right;    No current facility-administered medications for this encounter.    Current Outpatient Medications  Medication Sig Dispense Refill Last Dose  . aspirin EC 81 MG tablet Take 81 mg by mouth daily.     . bisacodyl (BISACODYL) 5 MG EC tablet Take 5 mg by mouth daily as needed for moderate constipation.     . Cholecalciferol (VITAMIN D PO) Take 1 capsule by mouth daily.     . hydrochlorothiazide (HYDRODIURIL) 50 MG tablet Take 50 mg by mouth daily with lunch.    06/15/2016 at 1000  . verapamil (CALAN-SR) 240 MG CR tablet Take 240 mg by mouth daily with lunch.    06/15/2016 at 1000  . vitamin B-12 (CYANOCOBALAMIN) 250 MCG tablet Take 250 mcg by mouth daily.     . vitamin C (ASCORBIC ACID) 500 MG tablet Take 500 mg by mouth daily.   06/15/2016 at 1000  . vitamin E 400 UNIT capsule Take 400 Units by mouth daily.  06/15/2016 at 1000  . docusate sodium (COLACE) 100 MG capsule Take 1 capsule (100 mg total) by mouth 2 (two) times daily. (Patient not taking: Reported on 04/30/2017) 10 capsule 0 Not Taking at Unknown time  . ferrous sulfate (FERROUSUL) 325 (65 FE) MG tablet Take 1 tablet (325 mg total) by mouth 3 (three) times daily with meals. (Patient not taking: Reported on 04/30/2017)   Not Taking at Unknown time  . polyethylene glycol (MIRALAX / GLYCOLAX) packet Take 17 g by mouth 2 (two) times daily. (Patient not taking: Reported on 04/30/2017) 14 each 0 Not Taking at Unknown time   No Known Allergies   Social History   Tobacco Use  . Smoking status: Former Smoker    Years: 6.00  . Smokeless  tobacco: Never Used  . Tobacco comment: 40 years ago  Substance Use Topics  . Alcohol use: No    Family History  Problem Relation Age of Onset  . Arthritis Unknown      Review of Systems  Constitutional: Negative.   HENT: Negative.   Eyes: Negative.   Respiratory: Negative.   Cardiovascular: Negative.   Gastrointestinal: Negative.   Genitourinary: Negative.   Musculoskeletal: Positive for joint pain.  Skin: Negative.   Neurological: Negative.   Endo/Heme/Allergies: Negative.   Psychiatric/Behavioral: Negative.     Objective:  Physical Exam  Constitutional: She is oriented to person, place, and time. She appears well-developed.  HENT:  Head: Normocephalic.  Eyes: Pupils are equal, round, and reactive to light.  Neck: Neck supple. No JVD present. No tracheal deviation present. No thyromegaly present.  Cardiovascular: Normal rate, regular rhythm and intact distal pulses.  Respiratory: Effort normal and breath sounds normal. No respiratory distress. She has no wheezes.  GI: Soft. There is no tenderness. There is no guarding.  Musculoskeletal:       Left knee: She exhibits decreased range of motion, swelling and bony tenderness. She exhibits no ecchymosis, no deformity, no laceration and no erythema. Tenderness found.  Lymphadenopathy:    She has no cervical adenopathy.  Neurological: She is alert and oriented to person, place, and time.  Skin: Skin is warm and dry.  Psychiatric: She has a normal mood and affect.      Labs:  Estimated body mass index is 29.79 kg/m as calculated from the following:   Height as of 05/04/17: 5\' 5"  (1.651 m).   Weight as of 05/04/17: 81.2 kg (179 lb).   Imaging Review Plain radiographs demonstrate severe degenerative joint disease of the left knee(s). The bone quality appears to be good for age and reported activity level.   Preoperative templating of the joint replacement has been completed, documented, and submitted to the Operating  Room personnel in order to optimize intra-operative equipment management.    Patient's anticipated LOS is less than 2 midnights, meeting these requirements: - Lives within 1 hour of care - Has a competent adult at home to recover with post-op recover - NO history of  - Chronic pain requiring opiods  - Diabetes  - Coronary Artery Disease  - Heart failure  - Heart attack  - Stroke  - DVT/VTE  - Cardiac arrhythmia  - Respiratory Failure/COPD  - Renal failure  - Anemia         Assessment/Plan:  End stage arthritis, left knee   The patient history, physical examination, clinical judgment of the provider and imaging studies are consistent with end stage degenerative joint disease of the left knee(s) and total knee  arthroplasty is deemed medically necessary. The treatment options including medical management, injection therapy arthroscopy and arthroplasty were discussed at length. The risks and benefits of total knee arthroplasty were presented and reviewed. The risks due to aseptic loosening, infection, stiffness, patella tracking problems, thromboembolic complications and other imponderables were discussed. The patient acknowledged the explanation, agreed to proceed with the plan and consent was signed. Patient is being admitted for inpatient treatment for surgery, pain control, PT, OT, prophylactic antibiotics, VTE prophylaxis, progressive ambulation and ADL's and discharge planning. The patient is planning to be discharged home.    [place] Ravina Milner   PA-C  05/09/2017, 2:38 PM

## 2017-05-10 MED ORDER — TRANEXAMIC ACID 1000 MG/10ML IV SOLN
1000.0000 mg | INTRAVENOUS | Status: AC
  Administered 2017-05-11: 1000 mg via INTRAVENOUS
  Filled 2017-05-10: qty 1100

## 2017-05-11 ENCOUNTER — Telehealth (HOSPITAL_COMMUNITY): Payer: Self-pay | Admitting: *Deleted

## 2017-05-11 ENCOUNTER — Other Ambulatory Visit: Payer: Self-pay

## 2017-05-11 ENCOUNTER — Observation Stay (HOSPITAL_COMMUNITY)
Admission: RE | Admit: 2017-05-11 | Discharge: 2017-05-12 | Disposition: A | Discharge status: Home / Self Care | Payer: Medicare HMO | Source: Ambulatory Visit | Attending: Orthopedic Surgery | Admitting: Orthopedic Surgery

## 2017-05-11 ENCOUNTER — Encounter (HOSPITAL_COMMUNITY)
Admission: RE | Disposition: A | Discharge status: Home / Self Care | Payer: Self-pay | Source: Ambulatory Visit | Attending: Orthopedic Surgery

## 2017-05-11 ENCOUNTER — Encounter (HOSPITAL_COMMUNITY): Payer: Self-pay

## 2017-05-11 ENCOUNTER — Inpatient Hospital Stay (HOSPITAL_COMMUNITY): Payer: Medicare HMO | Admitting: Registered Nurse

## 2017-05-11 ENCOUNTER — Encounter (HOSPITAL_COMMUNITY): Payer: Self-pay | Admitting: Emergency Medicine

## 2017-05-11 DIAGNOSIS — Z6829 Body mass index (BMI) 29.0-29.9, adult: Secondary | ICD-10-CM | POA: Diagnosis not present

## 2017-05-11 DIAGNOSIS — Z96659 Presence of unspecified artificial knee joint: Secondary | ICD-10-CM

## 2017-05-11 DIAGNOSIS — Z96641 Presence of right artificial hip joint: Secondary | ICD-10-CM | POA: Diagnosis not present

## 2017-05-11 DIAGNOSIS — Z87891 Personal history of nicotine dependence: Secondary | ICD-10-CM | POA: Insufficient documentation

## 2017-05-11 DIAGNOSIS — Z79899 Other long term (current) drug therapy: Secondary | ICD-10-CM | POA: Insufficient documentation

## 2017-05-11 DIAGNOSIS — E663 Overweight: Secondary | ICD-10-CM | POA: Diagnosis present

## 2017-05-11 DIAGNOSIS — E876 Hypokalemia: Secondary | ICD-10-CM | POA: Diagnosis not present

## 2017-05-11 DIAGNOSIS — M1712 Unilateral primary osteoarthritis, left knee: Secondary | ICD-10-CM | POA: Diagnosis not present

## 2017-05-11 DIAGNOSIS — I10 Essential (primary) hypertension: Secondary | ICD-10-CM | POA: Diagnosis not present

## 2017-05-11 DIAGNOSIS — Z7982 Long term (current) use of aspirin: Secondary | ICD-10-CM | POA: Diagnosis not present

## 2017-05-11 DIAGNOSIS — Z96652 Presence of left artificial knee joint: Secondary | ICD-10-CM

## 2017-05-11 DIAGNOSIS — G8918 Other acute postprocedural pain: Secondary | ICD-10-CM | POA: Diagnosis not present

## 2017-05-11 HISTORY — PX: TOTAL KNEE ARTHROPLASTY: SHX125

## 2017-05-11 LAB — TYPE AND SCREEN
ABO/RH(D): O POS
Antibody Screen: NEGATIVE

## 2017-05-11 SURGERY — ARTHROPLASTY, KNEE, TOTAL
Anesthesia: Regional | Site: Knee | Laterality: Left

## 2017-05-11 MED ORDER — CHLORHEXIDINE GLUCONATE 4 % EX LIQD: 60.0000 mL | Freq: Once | CUTANEOUS | Status: DC

## 2017-05-11 MED ORDER — MENTHOL 3 MG MT LOZG: 1.0000 | LOZENGE | OROMUCOSAL | Status: DC | PRN

## 2017-05-11 MED ORDER — HYDROMORPHONE HCL 1 MG/ML IJ SOLN: 0.5000 mg | INTRAMUSCULAR | Status: DC | PRN

## 2017-05-11 MED ORDER — PROPOFOL 10 MG/ML IV BOLUS
INTRAVENOUS | Status: DC | PRN
  Administered 2017-05-11: 20 mg via INTRAVENOUS
  Administered 2017-05-11: 10 mg via INTRAVENOUS
  Administered 2017-05-11: 120 mg via INTRAVENOUS
  Administered 2017-05-11: 10 mg via INTRAVENOUS

## 2017-05-11 MED ORDER — OXYCODONE HCL 5 MG PO TABS: 5.0000 mg | ORAL_TABLET | ORAL | 0 refills | Status: DC | PRN

## 2017-05-11 MED ORDER — PROPOFOL 10 MG/ML IV BOLUS
INTRAVENOUS | Status: AC
  Filled 2017-05-11: qty 20

## 2017-05-11 MED ORDER — POLYETHYLENE GLYCOL 3350 17 G PO PACK: 17.0000 g | PACK | Freq: Two times a day (BID) | ORAL | 0 refills | Status: DC

## 2017-05-11 MED ORDER — SODIUM CHLORIDE 0.9 % IV SOLN
1000.0000 mg | Freq: Once | INTRAVENOUS | Status: AC
  Administered 2017-05-11: 1000 mg via INTRAVENOUS
  Filled 2017-05-11: qty 1100

## 2017-05-11 MED ORDER — ONDANSETRON HCL 4 MG/2ML IJ SOLN: 4.0000 mg | Freq: Once | INTRAMUSCULAR | Status: DC | PRN

## 2017-05-11 MED ORDER — DIPHENHYDRAMINE HCL 12.5 MG/5ML PO ELIX: 12.5000 mg | ORAL_SOLUTION | ORAL | Status: DC | PRN

## 2017-05-11 MED ORDER — FENTANYL CITRATE (PF) 100 MCG/2ML IJ SOLN
INTRAMUSCULAR | Status: AC
  Filled 2017-05-11: qty 4

## 2017-05-11 MED ORDER — CEFAZOLIN SODIUM-DEXTROSE 2-4 GM/100ML-% IV SOLN: 2.0000 g | INTRAVENOUS | Status: DC

## 2017-05-11 MED ORDER — LACTATED RINGERS IV SOLN
INTRAVENOUS | Status: DC
Start: 2017-05-11 — End: 2017-05-11
  Administered 2017-05-11 (×2): via INTRAVENOUS

## 2017-05-11 MED ORDER — ASPIRIN 81 MG PO CHEW
81.0000 mg | CHEWABLE_TABLET | Freq: Two times a day (BID) | ORAL | Status: DC
  Administered 2017-05-11 – 2017-05-12 (×2): 81 mg via ORAL
  Filled 2017-05-11 (×2): qty 1

## 2017-05-11 MED ORDER — METHOCARBAMOL 1000 MG/10ML IJ SOLN
500.0000 mg | Freq: Four times a day (QID) | INTRAVENOUS | Status: DC | PRN
  Administered 2017-05-11: 500 mg via INTRAVENOUS
  Filled 2017-05-11: qty 550

## 2017-05-11 MED ORDER — FENTANYL CITRATE (PF) 100 MCG/2ML IJ SOLN
INTRAMUSCULAR | Status: AC
  Filled 2017-05-11: qty 2

## 2017-05-11 MED ORDER — MIDAZOLAM HCL 2 MG/2ML IJ SOLN
INTRAMUSCULAR | Status: AC
  Filled 2017-05-11: qty 2

## 2017-05-11 MED ORDER — STERILE WATER FOR IRRIGATION IR SOLN
Status: DC | PRN
  Administered 2017-05-11: 2000 mL

## 2017-05-11 MED ORDER — BUPIVACAINE HCL (PF) 0.25 % IJ SOLN
INTRAMUSCULAR | Status: DC | PRN
  Administered 2017-05-11: 30 mL

## 2017-05-11 MED ORDER — 0.9 % SODIUM CHLORIDE (POUR BTL) OPTIME
TOPICAL | Status: DC | PRN
  Administered 2017-05-11: 1000 mL

## 2017-05-11 MED ORDER — ONDANSETRON HCL 4 MG PO TABS: 4.0000 mg | ORAL_TABLET | Freq: Four times a day (QID) | ORAL | Status: DC | PRN

## 2017-05-11 MED ORDER — EPHEDRINE SULFATE-NACL 50-0.9 MG/10ML-% IV SOSY
PREFILLED_SYRINGE | INTRAVENOUS | Status: DC | PRN
  Administered 2017-05-11: 5 mg via INTRAVENOUS

## 2017-05-11 MED ORDER — METOCLOPRAMIDE HCL 5 MG PO TABS: 5.0000 mg | ORAL_TABLET | Freq: Three times a day (TID) | ORAL | Status: DC | PRN

## 2017-05-11 MED ORDER — ONDANSETRON HCL 4 MG/2ML IJ SOLN
INTRAMUSCULAR | Status: AC
  Filled 2017-05-11: qty 2

## 2017-05-11 MED ORDER — FERROUS SULFATE 325 (65 FE) MG PO TABS
325.0000 mg | ORAL_TABLET | Freq: Three times a day (TID) | ORAL | Status: DC
  Administered 2017-05-12 (×2): 325 mg via ORAL
  Filled 2017-05-11 (×2): qty 1

## 2017-05-11 MED ORDER — DOCUSATE SODIUM 100 MG PO CAPS: 100.0000 mg | ORAL_CAPSULE | Freq: Two times a day (BID) | ORAL | 0 refills | Status: DC

## 2017-05-11 MED ORDER — DEXAMETHASONE SODIUM PHOSPHATE 10 MG/ML IJ SOLN
10.0000 mg | Freq: Once | INTRAMUSCULAR | Status: AC
  Administered 2017-05-11: 10 mg via INTRAVENOUS

## 2017-05-11 MED ORDER — ASPIRIN 81 MG PO CHEW: 81.0000 mg | CHEWABLE_TABLET | Freq: Two times a day (BID) | ORAL | 0 refills | Status: AC

## 2017-05-11 MED ORDER — HYDROCHLOROTHIAZIDE 25 MG PO TABS
50.0000 mg | ORAL_TABLET | Freq: Every day | ORAL | Status: DC
  Administered 2017-05-12: 50 mg via ORAL
  Filled 2017-05-11: qty 2

## 2017-05-11 MED ORDER — KETOROLAC TROMETHAMINE 30 MG/ML IJ SOLN
INTRAMUSCULAR | Status: DC | PRN
  Administered 2017-05-11: 30 mg

## 2017-05-11 MED ORDER — CHLORHEXIDINE GLUCONATE 4 % EX LIQD: 60.0000 mL | Freq: Once | CUTANEOUS | Status: AC

## 2017-05-11 MED ORDER — PROPOFOL 10 MG/ML IV BOLUS
INTRAVENOUS | Status: AC
  Filled 2017-05-11: qty 60

## 2017-05-11 MED ORDER — CEFAZOLIN SODIUM-DEXTROSE 2-4 GM/100ML-% IV SOLN
2.0000 g | INTRAVENOUS | Status: AC
  Administered 2017-05-11: 2 g via INTRAVENOUS
  Filled 2017-05-11: qty 100

## 2017-05-11 MED ORDER — KETOROLAC TROMETHAMINE 30 MG/ML IJ SOLN
INTRAMUSCULAR | Status: AC
  Filled 2017-05-11: qty 1

## 2017-05-11 MED ORDER — POLYETHYLENE GLYCOL 3350 17 G PO PACK
17.0000 g | PACK | Freq: Two times a day (BID) | ORAL | Status: DC
  Administered 2017-05-12: 17 g via ORAL
  Filled 2017-05-11: qty 1

## 2017-05-11 MED ORDER — SODIUM CHLORIDE 0.9 % IJ SOLN
INTRAMUSCULAR | Status: AC
  Filled 2017-05-11: qty 50

## 2017-05-11 MED ORDER — ROPIVACAINE HCL 5 MG/ML IJ SOLN
INTRAMUSCULAR | Status: DC | PRN
  Administered 2017-05-11: 30 mL via EPIDURAL

## 2017-05-11 MED ORDER — DOCUSATE SODIUM 100 MG PO CAPS
100.0000 mg | ORAL_CAPSULE | Freq: Two times a day (BID) | ORAL | Status: DC
  Administered 2017-05-11 – 2017-05-12 (×2): 100 mg via ORAL
  Filled 2017-05-11 (×2): qty 1

## 2017-05-11 MED ORDER — FENTANYL CITRATE (PF) 100 MCG/2ML IJ SOLN
INTRAMUSCULAR | Status: DC | PRN
  Administered 2017-05-11 (×2): 25 ug via INTRAVENOUS
  Administered 2017-05-11: 50 ug via INTRAVENOUS
  Administered 2017-05-11 (×2): 25 ug via INTRAVENOUS

## 2017-05-11 MED ORDER — CELECOXIB 200 MG PO CAPS
200.0000 mg | ORAL_CAPSULE | Freq: Two times a day (BID) | ORAL | Status: DC
  Administered 2017-05-11 – 2017-05-12 (×2): 200 mg via ORAL
  Filled 2017-05-11 (×2): qty 1

## 2017-05-11 MED ORDER — ONDANSETRON HCL 4 MG/2ML IJ SOLN
INTRAMUSCULAR | Status: DC | PRN
  Administered 2017-05-11: 4 mg via INTRAVENOUS

## 2017-05-11 MED ORDER — BISACODYL 10 MG RE SUPP: 10.0000 mg | Freq: Every day | RECTAL | Status: DC | PRN

## 2017-05-11 MED ORDER — BUPIVACAINE HCL (PF) 0.25 % IJ SOLN
INTRAMUSCULAR | Status: AC
  Filled 2017-05-11: qty 30

## 2017-05-11 MED ORDER — METHOCARBAMOL 500 MG PO TABS: 500.0000 mg | ORAL_TABLET | Freq: Four times a day (QID) | ORAL | 0 refills | Status: DC | PRN

## 2017-05-11 MED ORDER — MAGNESIUM CITRATE PO SOLN: 1.0000 | Freq: Once | ORAL | Status: DC | PRN

## 2017-05-11 MED ORDER — ALUM & MAG HYDROXIDE-SIMETH 200-200-20 MG/5ML PO SUSP: 15.0000 mL | ORAL | Status: DC | PRN

## 2017-05-11 MED ORDER — ONDANSETRON HCL 4 MG/2ML IJ SOLN
4.0000 mg | Freq: Four times a day (QID) | INTRAMUSCULAR | Status: DC | PRN
  Administered 2017-05-11: 4 mg via INTRAVENOUS
  Filled 2017-05-11: qty 2

## 2017-05-11 MED ORDER — PHENOL 1.4 % MT LIQD: 1.0000 | OROMUCOSAL | Status: DC | PRN

## 2017-05-11 MED ORDER — OXYCODONE HCL 5 MG PO TABS
5.0000 mg | ORAL_TABLET | ORAL | Status: DC | PRN
  Administered 2017-05-12 (×2): 5 mg via ORAL
  Filled 2017-05-11 (×3): qty 1

## 2017-05-11 MED ORDER — DEXAMETHASONE SODIUM PHOSPHATE 10 MG/ML IJ SOLN
INTRAMUSCULAR | Status: AC
  Filled 2017-05-11: qty 1

## 2017-05-11 MED ORDER — OXYCODONE HCL 5 MG PO TABS
10.0000 mg | ORAL_TABLET | ORAL | Status: DC | PRN
  Administered 2017-05-12: 10 mg via ORAL
  Filled 2017-05-11: qty 2

## 2017-05-11 MED ORDER — CEFAZOLIN SODIUM-DEXTROSE 2-4 GM/100ML-% IV SOLN
2.0000 g | Freq: Four times a day (QID) | INTRAVENOUS | Status: AC
  Administered 2017-05-11 – 2017-05-12 (×2): 2 g via INTRAVENOUS
  Filled 2017-05-11 (×2): qty 100

## 2017-05-11 MED ORDER — MIDAZOLAM HCL 2 MG/2ML IJ SOLN
2.0000 mg | Freq: Once | INTRAMUSCULAR | Status: DC
  Filled 2017-05-11: qty 2

## 2017-05-11 MED ORDER — SODIUM CHLORIDE 0.9 % IJ SOLN
INTRAMUSCULAR | Status: DC | PRN
  Administered 2017-05-11: 30 mL

## 2017-05-11 MED ORDER — FENTANYL CITRATE (PF) 100 MCG/2ML IJ SOLN
100.0000 ug | Freq: Once | INTRAMUSCULAR | Status: AC
  Administered 2017-05-11: 50 ug via INTRAVENOUS
  Filled 2017-05-11: qty 2

## 2017-05-11 MED ORDER — EPHEDRINE 5 MG/ML INJ
INTRAVENOUS | Status: AC
  Filled 2017-05-11: qty 10

## 2017-05-11 MED ORDER — FENTANYL CITRATE (PF) 100 MCG/2ML IJ SOLN
25.0000 ug | INTRAMUSCULAR | Status: DC | PRN
  Administered 2017-05-11 (×3): 50 ug via INTRAVENOUS

## 2017-05-11 MED ORDER — DEXAMETHASONE SODIUM PHOSPHATE 10 MG/ML IJ SOLN
10.0000 mg | Freq: Once | INTRAMUSCULAR | Status: AC
  Administered 2017-05-12: 10 mg via INTRAVENOUS
  Filled 2017-05-11: qty 1

## 2017-05-11 MED ORDER — VERAPAMIL HCL ER 240 MG PO TBCR
240.0000 mg | EXTENDED_RELEASE_TABLET | Freq: Every day | ORAL | Status: DC
  Administered 2017-05-12: 240 mg via ORAL
  Filled 2017-05-11: qty 1

## 2017-05-11 MED ORDER — METHOCARBAMOL 500 MG PO TABS
500.0000 mg | ORAL_TABLET | Freq: Four times a day (QID) | ORAL | Status: DC | PRN
  Administered 2017-05-12 (×2): 500 mg via ORAL
  Filled 2017-05-11 (×2): qty 1

## 2017-05-11 MED ORDER — SODIUM CHLORIDE 0.9 % IV SOLN
INTRAVENOUS | Status: DC
  Administered 2017-05-11 – 2017-05-12 (×2): via INTRAVENOUS

## 2017-05-11 MED ORDER — FERROUS SULFATE 325 (65 FE) MG PO TABS: 325.0000 mg | ORAL_TABLET | Freq: Three times a day (TID) | ORAL | 3 refills | Status: DC

## 2017-05-11 MED ORDER — METOCLOPRAMIDE HCL 5 MG/ML IJ SOLN
5.0000 mg | Freq: Three times a day (TID) | INTRAMUSCULAR | Status: DC | PRN
  Administered 2017-05-11: 10 mg via INTRAVENOUS
  Filled 2017-05-11: qty 2

## 2017-05-11 SURGICAL SUPPLY — 48 items
ADH SKN CLS APL DERMABOND .7 (GAUZE/BANDAGES/DRESSINGS) ×1
BAG DECANTER FOR FLEXI CONT (MISCELLANEOUS) IMPLANT
BAG SPEC THK2 15X12 ZIP CLS (MISCELLANEOUS)
BAG ZIPLOCK 12X15 (MISCELLANEOUS) IMPLANT
BANDAGE ACE 6X5 VEL STRL LF (GAUZE/BANDAGES/DRESSINGS) ×2 IMPLANT
BLADE SAW SGTL 11.0X1.19X90.0M (BLADE) ×1 IMPLANT
BLADE SAW SGTL 13.0X1.19X90.0M (BLADE) ×2 IMPLANT
BOWL SMART MIX CTS (DISPOSABLE) ×2 IMPLANT
CAPT KNEE TOTAL 3 ATTUNE ×1 IMPLANT
CEMENT HV SMART SET (Cement) ×2 IMPLANT
COVER SURGICAL LIGHT HANDLE (MISCELLANEOUS) ×2 IMPLANT
CUFF TOURN SGL QUICK 34 (TOURNIQUET CUFF) ×2
CUFF TRNQT CYL 34X4X40X1 (TOURNIQUET CUFF) ×1 IMPLANT
DECANTER SPIKE VIAL GLASS SM (MISCELLANEOUS) ×2 IMPLANT
DERMABOND ADVANCED (GAUZE/BANDAGES/DRESSINGS) ×1
DERMABOND ADVANCED .7 DNX12 (GAUZE/BANDAGES/DRESSINGS) ×1 IMPLANT
DRAPE TOP 10253 STERILE (DRAPES) IMPLANT
DRAPE U-SHAPE 47X51 STRL (DRAPES) ×2 IMPLANT
DRESSING AQUACEL AG SP 3.5X10 (GAUZE/BANDAGES/DRESSINGS) ×1 IMPLANT
DRSG AQUACEL AG SP 3.5X10 (GAUZE/BANDAGES/DRESSINGS) ×2
DURAPREP 26ML APPLICATOR (WOUND CARE) ×4 IMPLANT
ELECT REM PT RETURN 15FT ADLT (MISCELLANEOUS) ×2 IMPLANT
GLOVE BIOGEL M 7.0 STRL (GLOVE) IMPLANT
GLOVE BIOGEL PI IND STRL 7.5 (GLOVE) ×1 IMPLANT
GLOVE BIOGEL PI IND STRL 8.5 (GLOVE) ×1 IMPLANT
GLOVE BIOGEL PI INDICATOR 7.5 (GLOVE) ×1
GLOVE BIOGEL PI INDICATOR 8.5 (GLOVE) ×1
GLOVE ECLIPSE 8.0 STRL XLNG CF (GLOVE) ×3 IMPLANT
GLOVE ORTHO TXT STRL SZ7.5 (GLOVE) ×4 IMPLANT
GOWN STRL REUS W/TWL 2XL LVL3 (GOWN DISPOSABLE) ×2 IMPLANT
GOWN STRL REUS W/TWL LRG LVL3 (GOWN DISPOSABLE) ×2 IMPLANT
HANDPIECE INTERPULSE COAX TIP (DISPOSABLE) ×2
MANIFOLD NEPTUNE II (INSTRUMENTS) ×2 IMPLANT
PACK TOTAL KNEE CUSTOM (KITS) ×2 IMPLANT
POSITIONER SURGICAL ARM (MISCELLANEOUS) ×2 IMPLANT
SET HNDPC FAN SPRY TIP SCT (DISPOSABLE) ×1 IMPLANT
SET PAD KNEE POSITIONER (MISCELLANEOUS) ×2 IMPLANT
SUT MNCRL AB 4-0 PS2 18 (SUTURE) ×2 IMPLANT
SUT STRATAFIX PDS+ 0 24IN (SUTURE) ×2 IMPLANT
SUT VIC AB 1 CT1 36 (SUTURE) ×2 IMPLANT
SUT VIC AB 2-0 CT1 27 (SUTURE) ×6
SUT VIC AB 2-0 CT1 TAPERPNT 27 (SUTURE) ×3 IMPLANT
SYR 50ML LL SCALE MARK (SYRINGE) ×1 IMPLANT
TRAY FOLEY CATH 14FRSI W/METER (CATHETERS) ×1 IMPLANT
TRAY FOLEY W/METER SILVER 16FR (SET/KITS/TRAYS/PACK) ×1 IMPLANT
WATER STERILE IRR 1000ML POUR (IV SOLUTION) ×2 IMPLANT
WRAP KNEE MAXI GEL POST OP (GAUZE/BANDAGES/DRESSINGS) ×2 IMPLANT
YANKAUER SUCT BULB TIP 10FT TU (MISCELLANEOUS) ×2 IMPLANT

## 2017-05-11 NOTE — Anesthesia Procedure Notes (Signed)
Anesthesia Regional Block: Adductor canal block   Pre-Anesthetic Checklist: ,, timeout performed, Correct Patient, Correct Site, Correct Laterality, Correct Procedure,, site marked, risks and benefits discussed, Surgical consent,  Pre-op evaluation,  At surgeon's request and post-op pain management  Laterality: Left  Prep: chloraprep       Needles:  Injection technique: Single-shot  Needle Type: Echogenic Stimulator Needle     Needle Length: 10cm  Needle Gauge: 21     Additional Needles:   Procedures:,,,, ultrasound used (permanent image in chart),,,,  Narrative:  Start time: 05/11/2017 1:20 PM End time: 05/11/2017 1:30 PM Injection made incrementally with aspirations every 5 mL.  Performed by: Personally  Anesthesiologist: Murvin Natal, MD  Additional Notes: Functioning IV was confirmed and monitors were applied.  A 117mm 21ga Pajunk echogenic stimulator needle was used. Sterile prep, hand hygiene and sterile gloves were used.  Negative aspiration and negative test dose prior to incremental administration of local anesthetic. The patient tolerated the procedure well.

## 2017-05-11 NOTE — Progress Notes (Signed)
Dr. Roanna Banning notified that pt drank "less than a teaspoon" of milk with her medications this morning at 0700.

## 2017-05-11 NOTE — Transfer of Care (Signed)
Immediate Anesthesia Transfer of Care Note  Patient: April Gordon  Procedure(s) Performed: LEFT TOTAL KNEE ARTHROPLASTY (Left Knee)  Patient Location: PACU  Anesthesia Type:GA combined with regional for post-op pain  Level of Consciousness: drowsy and patient cooperative  Airway & Oxygen Therapy: Patient Spontanous Breathing and Patient connected to face mask oxygen  Post-op Assessment: Report given to RN and Post -op Vital signs reviewed and stable  Post vital signs: Reviewed and stable  Last Vitals:  Vitals Value Taken Time  BP 122/64 05/11/2017  4:26 PM  Temp    Pulse 66 05/11/2017  4:29 PM  Resp 12 05/11/2017  4:29 PM  SpO2 100 % 05/11/2017  4:29 PM  Vitals shown include unvalidated device data.  Last Pain:  Vitals:   05/11/17 1059  TempSrc:   PainSc: 0-No pain      Patients Stated Pain Goal: 4 (72/82/06 0156)  Complications: No apparent anesthesia complications

## 2017-05-11 NOTE — Anesthesia Preprocedure Evaluation (Addendum)
Anesthesia Evaluation  Patient identified by MRN, date of birth, ID band Patient awake    Reviewed: Allergy & Precautions, NPO status , Patient's Chart, lab work & pertinent test results  Airway Mallampati: II  TM Distance: >3 FB Neck ROM: Full    Dental no notable dental hx.    Pulmonary former smoker,    Pulmonary exam normal breath sounds clear to auscultation       Cardiovascular hypertension, Pt. on medications Normal cardiovascular exam Rhythm:Regular Rate:Normal  ECG: NSR, rate 68   Neuro/Psych negative neurological ROS  negative psych ROS   GI/Hepatic negative GI ROS, Neg liver ROS,   Endo/Other  negative endocrine ROS  Renal/GU negative Renal ROS     Musculoskeletal  (+) Arthritis , Osteoarthritis,    Abdominal   Peds  Hematology negative hematology ROS (+)   Anesthesia Other Findings Left knee osteoarthritis  Reproductive/Obstetrics                            Anesthesia Physical Anesthesia Plan  ASA: II  Anesthesia Plan: Spinal and Regional   Post-op Pain Management:  Regional for Post-op pain   Induction: Intravenous  PONV Risk Score and Plan: 2 and Dexamethasone, Ondansetron and Treatment may vary due to age or medical condition  Airway Management Planned: Natural Airway  Additional Equipment:   Intra-op Plan:   Post-operative Plan:   Informed Consent: I have reviewed the patients History and Physical, chart, labs and discussed the procedure including the risks, benefits and alternatives for the proposed anesthesia with the patient or authorized representative who has indicated his/her understanding and acceptance.   Dental advisory given  Plan Discussed with: CRNA  Anesthesia Plan Comments:         Anesthesia Quick Evaluation

## 2017-05-11 NOTE — Op Note (Signed)
NAME:  [facility]                      MEDICAL RECORD NO.:  [id number]                             FACILITY:  [facility]      PHYSICIAN:  Pietro Cassis. Alvan Dame, M.D.  DATE OF BIRTH:  02/02/1940      DATE OF PROCEDURE:  05/11/2017                                     OPERATIVE REPORT         PREOPERATIVE DIAGNOSIS:  Left knee osteoarthritis.      POSTOPERATIVE DIAGNOSIS:  Left knee osteoarthritis.      FINDINGS:  The patient was noted to have complete loss of cartilage and   bone-on-bone arthritis with associated osteophytes in the lateral and patellofemoral compartments of   the knee with a significant synovitis and associated effusion.      PROCEDURE:  Left total knee replacement.      COMPONENTS USED:  DePuy Attune rotating platform posterior stabilized knee   system, a size 5 femur, 5 tibia, size 6 mm PS AOX insert, and 38 anatomic patellar   button.      SURGEON:  Pietro Cassis. Alvan Dame, M.D.      ASSISTANT:  Danae Orleans, PA-C.      ANESTHESIA:  General and Spinal.      SPECIMENS:  None.      COMPLICATION:  None.      DRAINS:  None.  EBL: < 100 cc      TOURNIQUET TIME:  28 min at 250 mmHg     The patient was stable to the recovery room.      INDICATION FOR PROCEDURE:  Rosaisela Jamroz is a 77 y.o. female patient of   mine.  The patient had been seen, evaluated, and treated conservatively in the   office with medication, activity modification, and injections.  The patient had   radiographic changes of bone-on-bone arthritis with endplate sclerosis and osteophytes noted.      The patient failed conservative measures including medication, injections, and activity modification, and at this point was ready for more definitive measures.   Based on the radiographic changes and failed conservative measures, the patient   decided to proceed with total knee replacement.  Risks of infection,   DVT, component failure, need for revision surgery, postop course, and   expectations were all   discussed and reviewed.  Consent was obtained for benefit of pain   relief.      PROCEDURE IN DETAIL:  The patient was brought to the operative theater.   Once adequate anesthesia, preoperative antibiotics, 2 gm of Ancef, 1 gm of Tranexamic Acid, and 10 mg of Decadron administered, the patient was positioned supine with the left thigh tourniquet placed.  The  left lower extremity was prepped and draped in sterile fashion.  A time-   out was performed identifying the patient, planned procedure, and   extremity.      The left lower extremity was placed in the [facility] leg holder.  The leg was   exsanguinated, tourniquet elevated to 250 mmHg.  A midline incision was   made followed by median parapatellar arthrotomy.  Following initial   exposure, attention was  first directed to the patella.  Precut   measurement was noted to be 24 mm with significant osteophytes.  I resected down to 14 mm and used a   38 anatomic patellar button to restore patellar height as well as cover the cut   surface.      The lug holes were drilled and a metal shim was placed to protect the   patella from retractors and saw blades.      At this point, attention was now directed to the femur.  The femoral   canal was opened with a drill, irrigated to try to prevent fat emboli.  An   intramedullary rod was passed at 5 degrees valgus, 9 mm of bone was   resected off the distal femur.  Following this resection, the tibia was   subluxated anteriorly.  Using the extramedullary guide, 2 mm of bone was resected off   the proximal lateral tibia.  We confirmed the gap would be   stable medially and laterally with a size 5 spacer block as well as confirmed   the cut was perpendicular in the coronal plane, checking with an alignment rod.      Once this was done, I sized the femur to be a size 5 in the anterior-   posterior dimension, chose a standard component based on medial and   lateral dimension.  The size 5 rotation block  was then pinned in   position anterior referenced using the C-clamp to set rotation.  The   anterior, posterior, and  chamfer cuts were made without difficulty nor   notching making certain that I was along the anterior cortex to help   with flexion gap stability.      The final box cut was made off the lateral aspect of distal femur.      At this point, the tibia was sized to be a size 5, the size 5 tray was   then pinned in position through the medial third of the tubercle,   drilled, and keel punched.  Trial reduction was now carried with a 5 femur,  5 tibia, a size 6 mm PS insert, and the 38 anatomic patella botton.  The knee was brought to   extension, full extension with good flexion stability with the patella   tracking through the trochlea without application of pressure.  Given   all these findings the femoral lug holes were drilled and then the trial components removed.  Final components were   opened and cement was mixed.  The knee was irrigated with normal saline   solution and pulse lavage.  The synovial lining was   then injected with 30 cc of 0.25% Marcaine with epinephrine and 1 cc of Toradol plus 30 cc of NS for a total of 61 cc.      The knee was irrigated.  Final implants were then cemented onto clean and   dried cut surfaces of bone with the knee brought to extension with a size 6   mm PS trial insert.      Once the cement had fully cured, the excess cement was removed   throughout the knee.  I confirmed I was satisfied with the range of   motion and stability, and the final size 6 mm PS AOX insert was chosen.  It was   placed into the knee.      The tourniquet had been let down at 28 minutes.  No significant   hemostasis required.  The   extensor mechanism was then reapproximated using #1 Vicryl and #1 Stratafix sutures with the knee   in flexion.  The   remaining wound was closed with 2-0 Vicryl and running 4-0 Monocryl.   The knee was cleaned, dried, dressed  sterilely using Dermabond and   Aquacel dressing.  The patient was then   brought to recovery room in stable condition, tolerating the procedure   well.   Please note that Physician Assistant, Danae Orleans, PA-C, was present for the entirety of the case, and was utilized for pre-operative positioning, peri-operative retractor management, general facilitation of the procedure.  He was also utilized for primary wound closure at the end of the case.              Pietro Cassis Alvan Dame, M.D.    05/11/2017 1:09 PM

## 2017-05-11 NOTE — Discharge Instructions (Signed)

## 2017-05-11 NOTE — Anesthesia Postprocedure Evaluation (Signed)
Anesthesia Post Note  Patient: Kyndahl Gramling  Procedure(s) Performed: LEFT TOTAL KNEE ARTHROPLASTY (Left Knee)     Patient location during evaluation: PACU Anesthesia Type: Regional and General Level of consciousness: awake and alert Pain management: pain level controlled Vital Signs Assessment: post-procedure vital signs reviewed and stable Respiratory status: spontaneous breathing, nonlabored ventilation, respiratory function stable and patient connected to nasal cannula oxygen Cardiovascular status: blood pressure returned to baseline and stable Postop Assessment: no apparent nausea or vomiting Anesthetic complications: no    Last Vitals:  Vitals:   05/11/17 1645 05/11/17 1700  BP: 120/65 111/64  Pulse: 61 (!) 56  Resp: 11 11  Temp:    SpO2: 97% 100%    Last Pain:  Vitals:   05/11/17 1700  TempSrc:   PainSc: Asleep                 Cartier Washko P Connie Lasater

## 2017-05-11 NOTE — Progress Notes (Signed)
Assisted Dr. Ellender with left, ultrasound guided, adductor canal block. Side rails up, monitors on throughout procedure. See vital signs in flow sheet. Tolerated Procedure well.  

## 2017-05-11 NOTE — Anesthesia Procedure Notes (Signed)
Procedure Name: LMA Insertion Date/Time: 05/11/2017 2:32 PM Performed by: Dione Booze, CRNA Pre-anesthesia Checklist: Patient identified, Suction available, Patient being monitored and Emergency Drugs available Patient Re-evaluated:Patient Re-evaluated prior to induction Oxygen Delivery Method: Circle system utilized Preoxygenation: Pre-oxygenation with 100% oxygen Induction Type: IV induction Ventilation: Mask ventilation without difficulty LMA: LMA inserted LMA Size: 4.0 Number of attempts: 1 Placement Confirmation: positive ETCO2 and breath sounds checked- equal and bilateral Tube secured with: Tape Dental Injury: Teeth and Oropharynx as per pre-operative assessment

## 2017-05-11 NOTE — Interval H&P Note (Signed)
History and Physical Interval Note:  05/11/2017 11:25 AM  April Gordon  has presented today for surgery, with the diagnosis of Left knee osteoarthritis  The various methods of treatment have been discussed with the patient and family. After consideration of risks, benefits and other options for treatment, the patient has consented to  Procedure(s) with comments: LEFT TOTAL KNEE ARTHROPLASTY (Left) - 70 as a surgical intervention .  The patient's history has been reviewed, patient examined, no change in status, stable for surgery.  I have reviewed the patient's chart and labs.  Questions were answered to the patient's satisfaction.     [name]

## 2017-05-12 DIAGNOSIS — Z7982 Long term (current) use of aspirin: Secondary | ICD-10-CM | POA: Diagnosis not present

## 2017-05-12 DIAGNOSIS — Z79899 Other long term (current) drug therapy: Secondary | ICD-10-CM | POA: Diagnosis not present

## 2017-05-12 DIAGNOSIS — E663 Overweight: Secondary | ICD-10-CM | POA: Diagnosis not present

## 2017-05-12 DIAGNOSIS — Z6829 Body mass index (BMI) 29.0-29.9, adult: Secondary | ICD-10-CM | POA: Diagnosis not present

## 2017-05-12 DIAGNOSIS — I10 Essential (primary) hypertension: Secondary | ICD-10-CM | POA: Diagnosis not present

## 2017-05-12 DIAGNOSIS — Z87891 Personal history of nicotine dependence: Secondary | ICD-10-CM | POA: Diagnosis not present

## 2017-05-12 DIAGNOSIS — M1712 Unilateral primary osteoarthritis, left knee: Secondary | ICD-10-CM | POA: Diagnosis not present

## 2017-05-12 DIAGNOSIS — Z96641 Presence of right artificial hip joint: Secondary | ICD-10-CM | POA: Diagnosis not present

## 2017-05-12 LAB — BASIC METABOLIC PANEL
Anion gap: 9 (ref 5–15)
BUN: 11 mg/dL (ref 6–20)
CALCIUM: 8.5 mg/dL — AB (ref 8.9–10.3)
CHLORIDE: 104 mmol/L (ref 101–111)
CO2: 26 mmol/L (ref 22–32)
CREATININE: 0.65 mg/dL (ref 0.44–1.00)
GFR calc Af Amer: >60 mL/min (ref >60)
GFR calc non Af Amer: >60 mL/min (ref >60)
GLUCOSE: 130 mg/dL — AB (ref 65–99)
Potassium: 3.8 mmol/L (ref 3.5–5.1)
Sodium: 139 mmol/L (ref 135–145)

## 2017-05-12 LAB — CBC
HEMATOCRIT: 31.1 % — AB (ref 36.0–46.0)
HEMOGLOBIN: 10.2 g/dL — AB (ref 12.0–15.0)
MCH: 30.3 pg (ref 26.0–34.0)
MCHC: 32.8 g/dL (ref 30.0–36.0)
MCV: 92.3 fL (ref 78.0–100.0)
Platelets: 224 10*3/uL (ref 150–400)
RBC: 3.37 MIL/uL — ABNORMAL LOW (ref 3.87–5.11)
RDW: 12.6 % (ref 11.5–15.5)
WBC: 8.8 10*3/uL (ref 4.0–10.5)

## 2017-05-12 NOTE — Evaluation (Signed)
Physical Therapy Evaluation Patient Details Name: April Gordon MRN: [id number] DOB: 24-May-1940 Today's Date: 05/12/2017   History of Present Illness  s/P L TKA, H/O R DATHA  Clinical Impression  The patient is progressing well. Plans DC today after therapy. To go to OPPT. Pt admitted with above diagnosis. Pt currently with functional limitations due to the deficits listed below (see PT Problem List).  Pt will benefit from skilled PT to increase their independence and safety with mobility to allow discharge to the venue listed below.      Follow Up Recommendations Follow surgeon's recommendation for DC plan and follow-up therapies    Equipment Recommendations  None recommended by PT    Recommendations for Other Services       Precautions / Restrictions Precautions Precautions: Fall;Knee Restrictions Weight Bearing Restrictions: No      Mobility  Bed Mobility Overal bed mobility: Needs Assistance Bed Mobility: Supine to Sit     Supine to sit: Supervision     General bed mobility comments: manages left leg  Transfers Overall transfer level: Needs assistance Equipment used: Rolling walker (2 wheeled) Transfers: Sit to/from Stand Sit to Stand: Min guard         General transfer comment: cues for safety  Ambulation/Gait Ambulation/Gait assistance: Min guard Ambulation Distance (Feet): 200 Feet Assistive device: Standard walker Gait Pattern/deviations: Step-to pattern;Trunk flexed     General Gait Details: cues for sequence, posture,   Stairs            Wheelchair Mobility    Modified Rankin (Stroke Patients Only)       Balance                                             Pertinent Vitals/Pain Pain Assessment: 0-10 Pain Score: 2  Pain Location: l knee, calf Pain Descriptors / Indicators: Tightness Pain Intervention(s): Monitored during session;Premedicated before session    Home Living Family/patient expects to be discharged  to:: Private residence Living Arrangements: Alone;Children Available Help at Discharge: Family;Available 24 hours/day Type of Home: House Home Access: Stairs to enter   [employer] of Steps: 1 Home Layout: One level;Laundry or work area in [place]: Environmental consultant - standard;Cane - single point;Toilet riser Additional Comments: daughter there til Sunday; another local daughter and sister will help out--pt prefers not to have family there anymore than necessary    Prior Function Level of Independence: Independent;Independent with assistive device(s)         Comments: amb with cane prior to adm     Hand Dominance        Extremity/Trunk Assessment   Upper Extremity Assessment Upper Extremity Assessment: Overall WFL for tasks assessed    Lower Extremity Assessment Lower Extremity Assessment: LLE deficits/detail LLE Deficits / Details: + SLR, Knee flexion 0-60        Communication   Communication: No difficulties  Cognition Arousal/Alertness: Awake/alert Behavior During Therapy: WFL for tasks assessed/performed Overall Cognitive Status: Within Functional Limits for tasks assessed                                        General Comments      Exercises Total Joint Exercises Ankle Circles/Pumps: AROM;Both;10 reps Quad Sets: AROM;Both;10 reps Short Arc Quad: AROM;Left;10 reps Heel Slides:  AAROM;Left;10 reps Hip ABduction/ADduction: AAROM;Left;10 reps Straight Leg Raises: AROM;Left;10 reps   Assessment/Plan    PT Assessment Patient needs continued PT services  PT Problem List Decreased strength;Decreased range of motion;Decreased activity tolerance;Decreased knowledge of use of DME;Decreased safety awareness;Decreased mobility;Decreased knowledge of precautions;Pain       PT Treatment Interventions DME instruction;Therapeutic exercise;Gait training;Stair training;Functional mobility training;Therapeutic activities;Patient/family  education    PT Goals (Current goals can be found in the Care Plan section)  Acute Rehab PT Goals Patient Stated Goal: go home, get right knee fixed PT Goal Formulation: With patient Time For Goal Achievement: 05/14/17 Potential to Achieve Goals: Good    Frequency 7X/week   Barriers to discharge        Co-evaluation               AM-PAC PT "6 Clicks" Daily Activity  Outcome Measure Difficulty turning over in bed (including adjusting bedclothes, sheets and blankets)?: A Little Difficulty moving from lying on back to sitting on the side of the bed? : A Little Difficulty sitting down on and standing up from a chair with arms (e.g., wheelchair, bedside commode, etc,.)?: A Little Help needed moving to and from a bed to chair (including a wheelchair)?: A Little Help needed walking in hospital room?: A Little Help needed climbing 3-5 steps with a railing? : A Lot 6 Click Score: 17    End of Session   Activity Tolerance: Patient tolerated treatment well Patient left: in chair;with call bell/phone within reach;with chair alarm set Nurse Communication: Mobility status PT Visit Diagnosis: Unsteadiness on feet (R26.81)    Time: 1655-3748 PT Time Calculation (min) (ACUTE ONLY): 34 min   Charges:   PT Evaluation $PT Eval Low Complexity: 1 Low PT Treatments $Gait Training: 8-22 mins   PT G CodesTresa Endo PT [phone]   Claretha Cooper 05/12/2017, 10:34 AM

## 2017-05-12 NOTE — Progress Notes (Signed)
Physical Therapy Treatment Patient Details Name: April Gordon MRN: [id number] DOB: 05/05/1940 Today's Date: 05/12/2017    History of Present Illness s/P L TKA    PT Comments    Reviewed there exercises with patient and daughter.   Follow Up Recommendations  Follow surgeon's recommendation for DC plan and follow-up therapies     Equipment Recommendations  None recommended by PT    Recommendations for Other Services       Precautions / Restrictions Precautions Precautions: Fall;Knee    Mobility  Bed Mobility                  Wheelchair Mobility    Modified Rankin (Stroke Patients Only)       Balance                                            Cognition Arousal/Alertness: Awake/alert                                            Exercises Total Joint Exercises Ankle Circles/Pumps: AROM;Both;10 reps Quad Sets: AROM;Both;10 reps Towel Squeeze: AROM;Both;10 reps Short Arc Quad: AROM;Left;10 reps Heel Slides: AAROM;Left;10 reps Hip ABduction/ADduction: AAROM;Left;10 reps Straight Leg Raises: AROM;Left;10 reps Long Arc Quad: AROM;10 reps;Left Knee Flexion: AROM;Left;10 reps    General Comments        Pertinent Vitals/Pain Pain Score: 2  Pain Location: l knee, calf Pain Descriptors / Indicators: Tightness Pain Intervention(s): Monitored during session    Home Living                      Prior Function            PT Goals (current goals can now be found in the care plan section) Progress towards PT goals: Progressing toward goals    Frequency    7X/week      PT Plan Current plan remains appropriate    Co-evaluation              AM-PAC PT "6 Clicks" Daily Activity  Outcome Measure  Difficulty turning over in bed (including adjusting bedclothes, sheets and blankets)?: A Little Difficulty moving from lying on back to sitting on the side of the bed? : A Little Difficulty sitting down  on and standing up from a chair with arms (e.g., wheelchair, bedside commode, etc,.)?: A Little Help needed moving to and from a bed to chair (including a wheelchair)?: A Little Help needed walking in hospital room?: A Little Help needed climbing 3-5 steps with a railing? : A Lot 6 Click Score: 17    End of Session   Activity Tolerance: Patient tolerated treatment well Patient left: in chair;with call bell/phone within reach;with family/visitor present Nurse Communication: Mobility status PT Visit Diagnosis: Unsteadiness on feet (R26.81)     Time: 8250-5397 PT Time Calculation (min) (ACUTE ONLY): 27 min  Charges:  $Therapeutic Exercise: 23-37 mins                    G Codes:       here}   Claretha Cooper 05/12/2017, 4:14 PM

## 2017-05-12 NOTE — Progress Notes (Signed)
Patient discharged to home with family. Given all belongings, instructions, prescriptions. Family present for teaching. Verbalized understanding of instructions. Escorted to pov via w/c.

## 2017-05-12 NOTE — Progress Notes (Signed)
     Subjective: 1 Day Post-Op Procedure(s) (LRB): LEFT TOTAL KNEE ARTHROPLASTY (Left)   Patient reports pain as mild, pain controlled.  No events throughout the night. Discussed activity levels for the next 6 weeks.  Ready to be discharged home.   Objective:   VITALS:   Vitals:   05/12/17 0046 05/12/17 0558  BP: 119/63 (!) 94/58  Pulse: (!) 59 (!) 53  Resp: 14 15  Temp: 97.6 F (36.4 C) 97.6 F (36.4 C)  SpO2: 100% 100%    Dorsiflexion/Plantar flexion intact Incision: dressing C/D/I No cellulitis present Compartment soft  LABS Recent Labs    05/12/17 0552  HGB 10.2*  HCT 31.1*  WBC 8.8  PLT 224    Recent Labs    05/12/17 0552  NA 139  K 3.8  BUN 11  CREATININE 0.65  GLUCOSE 130*     Assessment/Plan: 1 Day Post-Op Procedure(s) (LRB): LEFT TOTAL KNEE ARTHROPLASTY (Left) Foley cath d/c'ed Advance diet Up with therapy D/C IV fluids Discharge home Follow up in 2 weeks at [facility]. Follow up with OLIN,Kelbi Renstrom D in 2 weeks.  Contact information:  [facility] [address], [place] [phone]    Overweight (BMI 25-29.9) Estimated body mass index is 29.79 kg/m as calculated from the following:   Height as of this encounter: 5\' 5"  (1.651 m).   Weight as of this encounter: 81.2 kg (179 lb). Patient also counseled that weight may inhibit the healing process Patient counseled that losing weight will help with future health issues       West Pugh. Antonea Gaut   PAC  05/12/2017, 8:05 AM

## 2017-05-12 NOTE — Progress Notes (Signed)
Physical Therapy Treatment Patient Details Name: Aden Youngman MRN: [id number] DOB: Mar 20, 1940 Today's Date: 05/12/2017    History of Present Illness s/P L TKA    PT Comments    Progressing well/ will review exercises then ready for DC.  Follow Up Recommendations  Follow surgeon's recommendation for DC plan and follow-up therapies     Equipment Recommendations  None recommended by PT    Recommendations for Other Services       Precautions / Restrictions Precautions Precautions: Fall;Knee    Mobility  Bed Mobility Overal bed mobility: Needs Assistance Bed Mobility: Supine to Sit     Supine to sit: Supervision     General bed mobility comments: in recliner  Transfers Overall transfer level: Needs assistance Equipment used: Rolling walker (2 wheeled) Transfers: Sit to/from Stand Sit to Stand: Min guard         General transfer comment: cues for safety  Ambulation/Gait Ambulation/Gait assistance: Min guard Ambulation Distance (Feet): 60 Feet Assistive device: Rolling walker (2 wheeled) Gait Pattern/deviations: Step-to pattern;Step-through pattern     General Gait Details: cues for sequence, posture,    Stairs Stairs: Yes Stairs assistance: Min assist Stair Management: One rail Left;Forwards Number of Stairs: 2 General stair comments: practiced x 2 for safety with rail and HHA   Wheelchair Mobility    Modified Rankin (Stroke Patients Only)       Balance                                            Cognition Arousal/Alertness: Awake/alert Behavior During Therapy: WFL for tasks assessed/performed Overall Cognitive Status: Within Functional Limits for tasks assessed                                        Exercises     General Comments        Pertinent Vitals/Pain Pain Assessment: 0-10 Pain Score: 2  Pain Location: l knee, calf Pain Descriptors / Indicators: Tightness Pain Intervention(s): Monitored  during session    Home Living Family/patient expects to be discharged to:: Private residence Living Arrangements: Alone;Children Available Help at Discharge: Family;Available 24 hours/day Type of Home: House Home Access: Stairs to enter   Home Layout: One level;Laundry or work area in [place]: Environmental consultant - standard;Cane - single point;Toilet riser Additional Comments: daughter there til Sunday; another local daughter and sister will help out--pt prefers not to have family there anymore than necessary    Prior Function Level of Independence: Independent;Independent with assistive device(s)      Comments: amb with cane prior to adm   PT Goals (current goals can now be found in the care plan section) Acute Rehab PT Goals Patient Stated Goal: go home, get right knee fixed PT Goal Formulation: With patient Time For Goal Achievement: 05/14/17 Potential to Achieve Goals: Good Progress towards PT goals: Progressing toward goals    Frequency    7X/week      PT Plan Current plan remains appropriate    Co-evaluation              AM-PAC PT "6 Clicks" Daily Activity  Outcome Measure  Difficulty turning over in bed (including adjusting bedclothes, sheets and blankets)?: A Little Difficulty moving from lying on back to sitting on the side of  the bed? : A Little Difficulty sitting down on and standing up from a chair with arms (e.g., wheelchair, bedside commode, etc,.)?: A Little Help needed moving to and from a bed to chair (including a wheelchair)?: A Little Help needed walking in hospital room?: A Little Help needed climbing 3-5 steps with a railing? : A Lot 6 Click Score: 17    End of Session   Activity Tolerance: Patient tolerated treatment well Patient left: (in BR with daughter) Nurse Communication: Mobility status PT Visit Diagnosis: Unsteadiness on feet (R26.81)     Time: 0601-5615 PT Time Calculation (min) (ACUTE ONLY): 16 min  Charges:  $Gait  Training: 8-22 mins                    G Codes:          Claretha Cooper 05/12/2017, 1:37 PM

## 2017-05-14 DIAGNOSIS — M25562 Pain in left knee: Secondary | ICD-10-CM | POA: Diagnosis not present

## 2017-05-14 DIAGNOSIS — M25662 Stiffness of left knee, not elsewhere classified: Secondary | ICD-10-CM | POA: Diagnosis not present

## 2017-05-14 DIAGNOSIS — M25462 Effusion, left knee: Secondary | ICD-10-CM | POA: Diagnosis not present

## 2017-05-14 DIAGNOSIS — M25561 Pain in right knee: Secondary | ICD-10-CM | POA: Diagnosis not present

## 2017-05-17 DIAGNOSIS — M25662 Stiffness of left knee, not elsewhere classified: Secondary | ICD-10-CM | POA: Diagnosis not present

## 2017-05-17 DIAGNOSIS — M25562 Pain in left knee: Secondary | ICD-10-CM | POA: Diagnosis not present

## 2017-05-17 DIAGNOSIS — M25561 Pain in right knee: Secondary | ICD-10-CM | POA: Diagnosis not present

## 2017-05-17 DIAGNOSIS — M25462 Effusion, left knee: Secondary | ICD-10-CM | POA: Diagnosis not present

## 2017-05-17 NOTE — Discharge Summary (Signed)
Physician Discharge Summary  Patient ID: April Gordon MRN: [id number] DOB/AGE: [birth year] 77 y.o.  Admit date: 05/11/2017 Discharge date: 05/12/2017   Procedures:  Procedure(s) (LRB): LEFT TOTAL KNEE ARTHROPLASTY (Left)  Attending Physician:  Dr. Paralee Cancel   Admission Diagnoses:   Left knee primary OA / pain  Discharge Diagnoses:  Principal Problem:   S/P left TKA Active Problems:   Overweight (BMI 25.0-29.9)  Past Medical History:  Diagnosis Date  . Arthritis   . HTN (hypertension)     HPI:    April Gordon, 77 y.o. female, has a history of pain and functional disability in the left knee due to arthritis and has failed non-surgical conservative treatments for greater than 12 weeks to includeNSAID's and/or analgesics, corticosteriod injections, use of assistive devices and activity modification.  Onset of symptoms was gradual, starting 2+ years ago with gradually worsening course since that time. The patient noted no past surgery on the left knee(s).  Patient currently rates pain in the left knee(s) at 1 out of 10 with activity, more complaints of stiffness. Patient has worsening of pain with activity and weight bearing, pain that interferes with activities of daily living, pain with passive range of motion, crepitus and joint swelling.  Patient has evidence of periarticular osteophytes and joint space narrowing by imaging studies.  There is no active infection.  Risks, benefits and expectations were discussed with the patient.  Risks including but not limited to the risk of anesthesia, blood clots, nerve damage, blood vessel damage, failure of the prosthesis, infection and up to and including death.  Patient understand the risks, benefits and expectations and wishes to proceed with surgery.   PCP: Celene Squibb, MD   Discharged Condition: good  Hospital Course:  Patient underwent the above stated procedure on 05/11/2017. Patient tolerated the procedure well and brought to the  recovery room in good condition and subsequently to the floor.  POD #1 BP: 94/58 ; Pulse: 53 ; Temp: 97.6 F (36.4 C) ; Resp: 15 Patient reports pain as mild, pain controlled.  No events throughout the night. Discussed activity levels for the next 6 weeks.  Ready to be discharged home.  Dorsiflexion/plantar flexion intact, incision: dressing C/D/I, no cellulitis present and compartment soft.   LABS  Basename    HGB     10.2  HCT     31.1    Discharge Exam: General appearance: alert, cooperative and no distress Extremities: Homans sign is negative, no sign of DVT, no edema, redness or tenderness in the calves or thighs and no ulcers, gangrene or trophic changes  Disposition:  Home with follow up in 2 weeks   Follow-up Information    Paralee Cancel, MD. Schedule an appointment as soon as possible for a visit in 2 weeks.   Specialty:  Orthopedic Surgery Contact information: [address] [place] [address] [phone]           Discharge Instructions    Call MD / Call 911   Complete by:  As directed    If you experience chest pain or shortness of breath, CALL 911 and be transported to the hospital emergency room.  If you develope a fever above 101 F, pus (white drainage) or increased drainage or redness at the wound, or calf pain, call your surgeon's office.   Change dressing   Complete by:  As directed    Maintain surgical dressing until follow up in the clinic. If the edges start to pull  up, may reinforce with tape. If the dressing is no longer working, may remove and cover with gauze and tape, but must keep the area dry and clean.  Call with any questions or concerns.   Constipation Prevention   Complete by:  As directed    Drink plenty of fluids.  Prune juice may be helpful.  You may use a stool softener, such as Colace (over the counter) 100 mg twice a day.  Use MiraLax (over the counter) for constipation as needed.   Diet - low sodium heart healthy    Complete by:  As directed    Discharge instructions   Complete by:  As directed    Maintain surgical dressing until follow up in the clinic. If the edges start to pull up, may reinforce with tape. If the dressing is no longer working, may remove and cover with gauze and tape, but must keep the area dry and clean.  Follow up in 2 weeks at [facility]. Call with any questions or concerns.   Increase activity slowly as tolerated   Complete by:  As directed    Weight bearing as tolerated with assist device (walker, cane, etc) as directed, use it as long as suggested by your surgeon or therapist, typically at least 4-6 weeks.   TED hose   Complete by:  As directed    Use stockings (TED hose) for 2 weeks on both leg(s).  You may remove them at night for sleeping.      Allergies as of 05/12/2017   No Known Allergies     Medication List    STOP taking these medications   aspirin EC 81 MG tablet Replaced by:  aspirin 81 MG chewable tablet     TAKE these medications   aspirin 81 MG chewable tablet Commonly known as:  ASPIRIN CHILDRENS Chew 1 tablet (81 mg total) by mouth 2 (two) times daily. Take for 4 weeks, then resume regular dose. Replaces:  aspirin EC 81 MG tablet   bisacodyl 5 MG EC tablet Generic drug:  bisacodyl Take 5 mg by mouth daily as needed for moderate constipation.   docusate sodium 100 MG capsule Commonly known as:  COLACE Take 1 capsule (100 mg total) by mouth 2 (two) times daily.   ferrous sulfate 325 (65 FE) MG tablet Commonly known as:  FERROUSUL Take 1 tablet (325 mg total) by mouth 3 (three) times daily with meals.   hydrochlorothiazide 50 MG tablet Commonly known as:  HYDRODIURIL Take 50 mg by mouth daily with lunch.   methocarbamol 500 MG tablet Commonly known as:  ROBAXIN Take 1 tablet (500 mg total) by mouth every 6 (six) hours as needed for muscle spasms.   oxyCODONE 5 MG immediate release tablet Commonly known as:  Oxy  IR/ROXICODONE Take 1-2 tablets (5-10 mg total) by mouth every 4 (four) hours as needed for moderate pain or severe pain.   polyethylene glycol packet Commonly known as:  MIRALAX / GLYCOLAX Take 17 g by mouth 2 (two) times daily.   verapamil 240 MG CR tablet Commonly known as:  CALAN-SR Take 240 mg by mouth daily with lunch.   vitamin B-12 250 MCG tablet Commonly known as:  CYANOCOBALAMIN Take 250 mcg by mouth daily.   vitamin C 500 MG tablet Commonly known as:  ASCORBIC ACID Take 500 mg by mouth daily.   VITAMIN D PO Take 1 capsule by mouth daily.   vitamin E 400 UNIT capsule Take 400 Units by mouth  daily.            Discharge Care Instructions  (From admission, onward)        Start     Ordered   05/12/17 0000  Change dressing    Comments:  Maintain surgical dressing until follow up in the clinic. If the edges start to pull up, may reinforce with tape. If the dressing is no longer working, may remove and cover with gauze and tape, but must keep the area dry and clean.  Call with any questions or concerns.   05/12/17 3241       Signed: West Pugh. Alesana Magistro   PA-C  05/17/2017, 10:11 AM

## 2017-05-21 DIAGNOSIS — M25662 Stiffness of left knee, not elsewhere classified: Secondary | ICD-10-CM | POA: Diagnosis not present

## 2017-05-21 DIAGNOSIS — M25562 Pain in left knee: Secondary | ICD-10-CM | POA: Diagnosis not present

## 2017-05-21 DIAGNOSIS — M25561 Pain in right knee: Secondary | ICD-10-CM | POA: Diagnosis not present

## 2017-05-21 DIAGNOSIS — M25462 Effusion, left knee: Secondary | ICD-10-CM | POA: Diagnosis not present

## 2017-05-25 ENCOUNTER — Ambulatory Visit (HOSPITAL_COMMUNITY)
Admission: RE | Admit: 2017-05-25 | Discharge: 2017-05-25 | Disposition: A | Discharge status: Home / Self Care | Payer: Medicare HMO | Source: Ambulatory Visit | Attending: Orthopedic Surgery | Admitting: Orthopedic Surgery

## 2017-05-25 ENCOUNTER — Other Ambulatory Visit (HOSPITAL_COMMUNITY): Payer: Self-pay | Admitting: Orthopedic Surgery

## 2017-05-25 DIAGNOSIS — M79604 Pain in right leg: Secondary | ICD-10-CM

## 2017-05-25 DIAGNOSIS — M25462 Effusion, left knee: Secondary | ICD-10-CM | POA: Diagnosis not present

## 2017-05-25 DIAGNOSIS — M79662 Pain in left lower leg: Secondary | ICD-10-CM | POA: Diagnosis not present

## 2017-05-25 DIAGNOSIS — R609 Edema, unspecified: Secondary | ICD-10-CM | POA: Diagnosis not present

## 2017-05-25 DIAGNOSIS — M7989 Other specified soft tissue disorders: Secondary | ICD-10-CM | POA: Diagnosis not present

## 2017-05-25 DIAGNOSIS — M25561 Pain in right knee: Secondary | ICD-10-CM | POA: Diagnosis not present

## 2017-05-25 DIAGNOSIS — M25662 Stiffness of left knee, not elsewhere classified: Secondary | ICD-10-CM | POA: Diagnosis not present

## 2017-05-25 DIAGNOSIS — M25562 Pain in left knee: Secondary | ICD-10-CM | POA: Diagnosis not present

## 2017-06-15 DIAGNOSIS — I10 Essential (primary) hypertension: Secondary | ICD-10-CM | POA: Diagnosis not present

## 2017-06-16 DIAGNOSIS — M25562 Pain in left knee: Secondary | ICD-10-CM | POA: Diagnosis not present

## 2017-06-16 DIAGNOSIS — M25462 Effusion, left knee: Secondary | ICD-10-CM | POA: Diagnosis not present

## 2017-06-16 DIAGNOSIS — Z6830 Body mass index (BMI) 30.0-30.9, adult: Secondary | ICD-10-CM | POA: Diagnosis not present

## 2017-06-16 DIAGNOSIS — I10 Essential (primary) hypertension: Secondary | ICD-10-CM | POA: Diagnosis not present

## 2017-06-16 DIAGNOSIS — M25662 Stiffness of left knee, not elsewhere classified: Secondary | ICD-10-CM | POA: Diagnosis not present

## 2017-06-16 DIAGNOSIS — M25561 Pain in right knee: Secondary | ICD-10-CM | POA: Diagnosis not present

## 2017-06-18 ENCOUNTER — Other Ambulatory Visit (HOSPITAL_COMMUNITY): Payer: Self-pay | Admitting: Internal Medicine

## 2017-06-18 DIAGNOSIS — Z780 Asymptomatic menopausal state: Secondary | ICD-10-CM

## 2017-06-25 DIAGNOSIS — M25561 Pain in right knee: Secondary | ICD-10-CM | POA: Diagnosis not present

## 2017-06-25 DIAGNOSIS — M25462 Effusion, left knee: Secondary | ICD-10-CM | POA: Diagnosis not present

## 2017-06-25 DIAGNOSIS — M25562 Pain in left knee: Secondary | ICD-10-CM | POA: Diagnosis not present

## 2017-06-25 DIAGNOSIS — M25662 Stiffness of left knee, not elsewhere classified: Secondary | ICD-10-CM | POA: Diagnosis not present

## 2017-06-28 DIAGNOSIS — Z96652 Presence of left artificial knee joint: Secondary | ICD-10-CM | POA: Diagnosis not present

## 2017-06-28 DIAGNOSIS — Z471 Aftercare following joint replacement surgery: Secondary | ICD-10-CM | POA: Diagnosis not present

## 2017-06-29 DIAGNOSIS — Z1212 Encounter for screening for malignant neoplasm of rectum: Secondary | ICD-10-CM | POA: Diagnosis not present

## 2017-06-29 DIAGNOSIS — Z1211 Encounter for screening for malignant neoplasm of colon: Secondary | ICD-10-CM | POA: Diagnosis not present

## 2017-06-30 DIAGNOSIS — M25662 Stiffness of left knee, not elsewhere classified: Secondary | ICD-10-CM | POA: Diagnosis not present

## 2017-06-30 DIAGNOSIS — M25562 Pain in left knee: Secondary | ICD-10-CM | POA: Diagnosis not present

## 2017-06-30 DIAGNOSIS — M25462 Effusion, left knee: Secondary | ICD-10-CM | POA: Diagnosis not present

## 2017-06-30 DIAGNOSIS — M25561 Pain in right knee: Secondary | ICD-10-CM | POA: Diagnosis not present

## 2017-07-16 ENCOUNTER — Ambulatory Visit (HOSPITAL_COMMUNITY)
Admission: RE | Admit: 2017-07-16 | Discharge: 2017-07-16 | Disposition: A | Discharge status: Home / Self Care | Payer: 59 | Source: Ambulatory Visit | Attending: Internal Medicine | Admitting: Internal Medicine

## 2017-07-16 DIAGNOSIS — Z13820 Encounter for screening for osteoporosis: Secondary | ICD-10-CM | POA: Diagnosis not present

## 2017-07-16 DIAGNOSIS — M85831 Other specified disorders of bone density and structure, right forearm: Secondary | ICD-10-CM | POA: Insufficient documentation

## 2017-07-16 DIAGNOSIS — Z780 Asymptomatic menopausal state: Secondary | ICD-10-CM | POA: Diagnosis not present

## 2017-08-16 DIAGNOSIS — Z1231 Encounter for screening mammogram for malignant neoplasm of breast: Secondary | ICD-10-CM | POA: Diagnosis not present

## 2017-12-13 DIAGNOSIS — M25562 Pain in left knee: Secondary | ICD-10-CM | POA: Diagnosis not present

## 2017-12-13 DIAGNOSIS — Z6830 Body mass index (BMI) 30.0-30.9, adult: Secondary | ICD-10-CM | POA: Diagnosis not present

## 2017-12-13 DIAGNOSIS — Z1321 Encounter for screening for nutritional disorder: Secondary | ICD-10-CM | POA: Diagnosis not present

## 2017-12-13 DIAGNOSIS — Z01818 Encounter for other preprocedural examination: Secondary | ICD-10-CM | POA: Diagnosis not present

## 2017-12-13 DIAGNOSIS — E559 Vitamin D deficiency, unspecified: Secondary | ICD-10-CM | POA: Diagnosis not present

## 2017-12-13 DIAGNOSIS — I10 Essential (primary) hypertension: Secondary | ICD-10-CM | POA: Diagnosis not present

## 2017-12-15 DIAGNOSIS — I10 Essential (primary) hypertension: Secondary | ICD-10-CM | POA: Diagnosis not present

## 2017-12-15 DIAGNOSIS — Z6831 Body mass index (BMI) 31.0-31.9, adult: Secondary | ICD-10-CM | POA: Diagnosis not present

## 2017-12-15 DIAGNOSIS — R748 Abnormal levels of other serum enzymes: Secondary | ICD-10-CM | POA: Diagnosis not present

## 2017-12-15 DIAGNOSIS — E669 Obesity, unspecified: Secondary | ICD-10-CM | POA: Diagnosis not present

## 2017-12-15 DIAGNOSIS — L209 Atopic dermatitis, unspecified: Secondary | ICD-10-CM | POA: Diagnosis not present

## 2017-12-15 DIAGNOSIS — Z0001 Encounter for general adult medical examination with abnormal findings: Secondary | ICD-10-CM | POA: Diagnosis not present

## 2017-12-15 DIAGNOSIS — M8580 Other specified disorders of bone density and structure, unspecified site: Secondary | ICD-10-CM | POA: Diagnosis not present

## 2018-05-14 IMAGING — DX DG HIP (WITH OR WITHOUT PELVIS) 1V PORT*R*
2 series · 2 of 2 positions shown · non-contrast
Comparison: Preop study from 04/30/2016

CLINICAL DATA: Right hip replacement via anterior approach

EXAM:
DG HIP (WITH OR WITHOUT PELVIS) 1V PORT RIGHT

[hip ap]
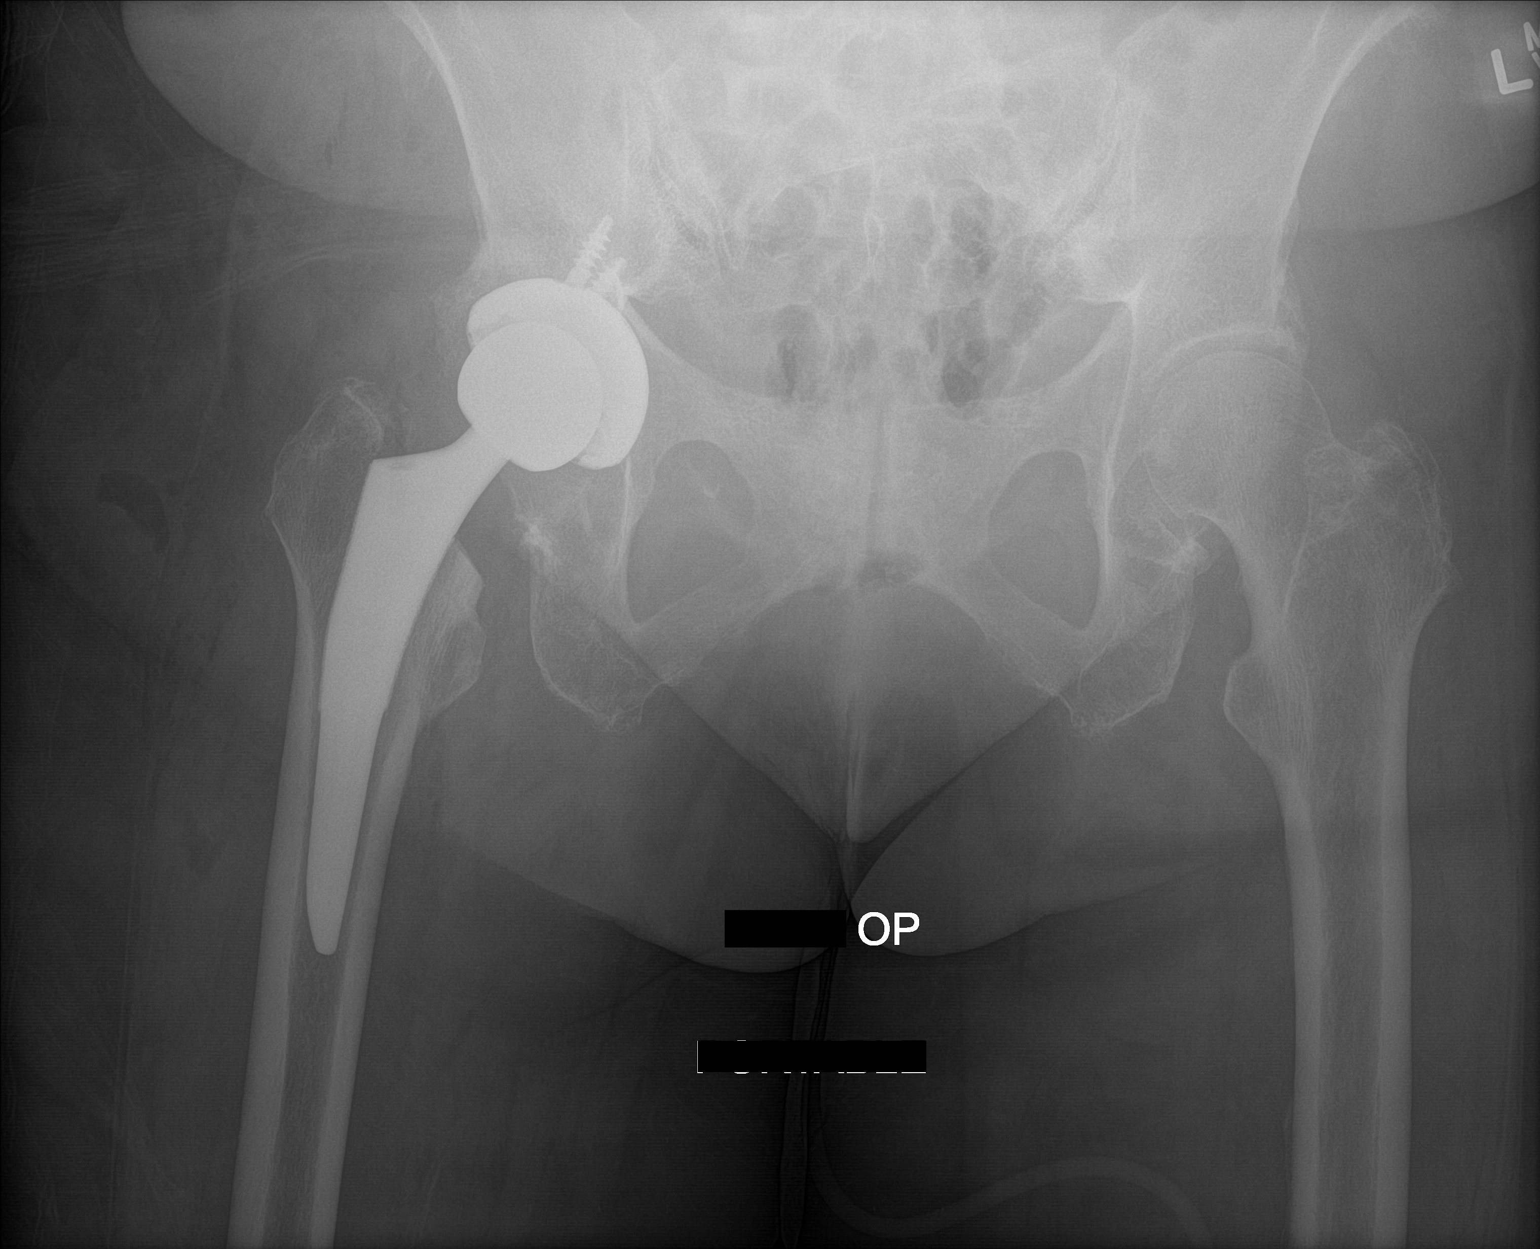

[hip frog leg]
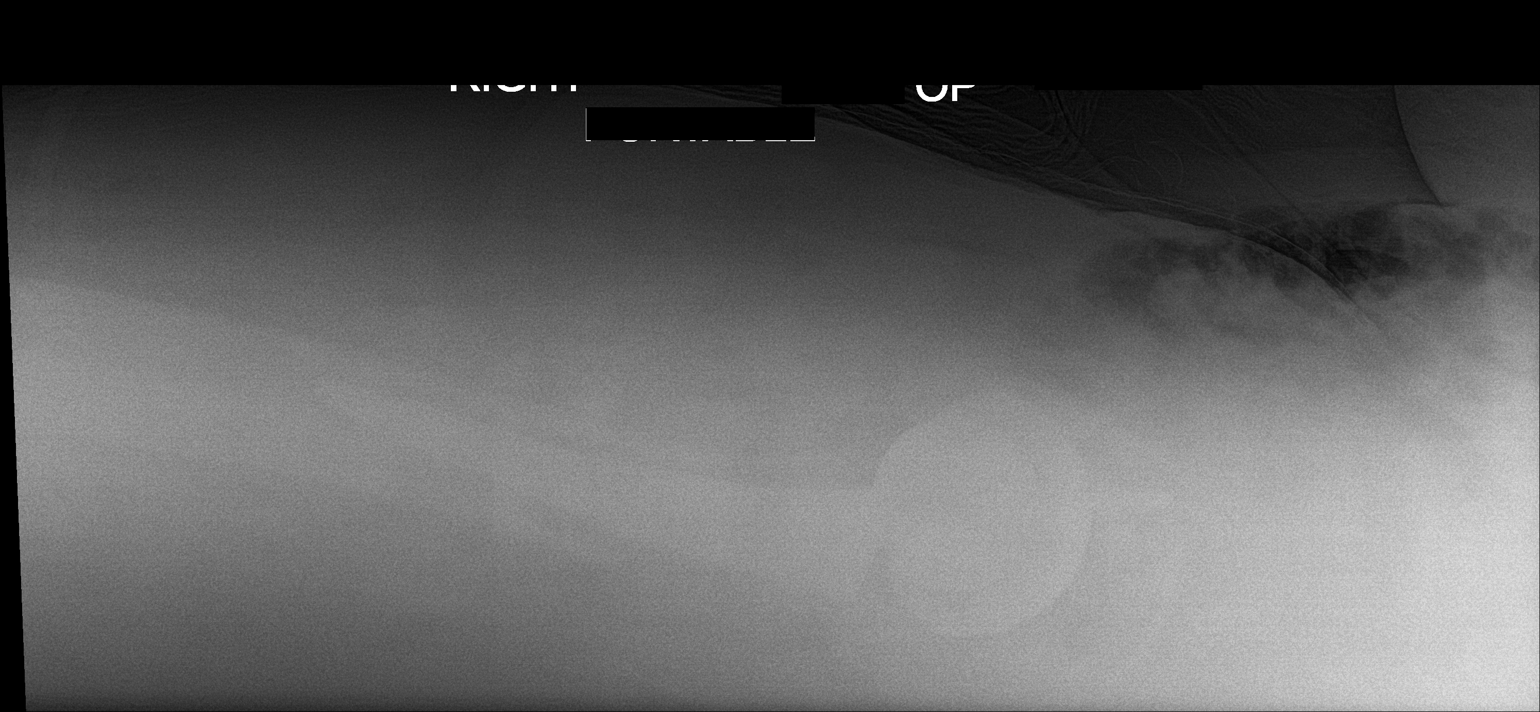

[2 of 2 positions shown; findings below may reference images not displayed]

FINDINGS: New right total hip arthroplasty with uncemented femoral stem. No
postoperative fracture or malalignment. Postoperative subcutaneous
emphysema is noted anterolaterally. Alignment appears near anatomic.
IMPRESSION: 1. New uncemented right total hip arthroplasty in near anatomic
appearing alignment.
2. No immediate postoperative complications identified.

## 2018-05-17 DIAGNOSIS — Z0000 Encounter for general adult medical examination without abnormal findings: Secondary | ICD-10-CM | POA: Diagnosis not present

## 2018-05-18 DIAGNOSIS — I10 Essential (primary) hypertension: Secondary | ICD-10-CM | POA: Diagnosis not present

## 2018-07-04 DIAGNOSIS — R7301 Impaired fasting glucose: Secondary | ICD-10-CM | POA: Diagnosis not present

## 2018-07-04 DIAGNOSIS — Z1329 Encounter for screening for other suspected endocrine disorder: Secondary | ICD-10-CM | POA: Diagnosis not present

## 2018-07-04 DIAGNOSIS — E669 Obesity, unspecified: Secondary | ICD-10-CM | POA: Diagnosis not present

## 2018-07-04 DIAGNOSIS — R748 Abnormal levels of other serum enzymes: Secondary | ICD-10-CM | POA: Diagnosis not present

## 2018-08-11 ENCOUNTER — Telehealth (HOSPITAL_COMMUNITY): Payer: Self-pay

## 2018-11-25 DIAGNOSIS — Z0001 Encounter for general adult medical examination with abnormal findings: Secondary | ICD-10-CM | POA: Diagnosis not present

## 2018-11-25 DIAGNOSIS — R748 Abnormal levels of other serum enzymes: Secondary | ICD-10-CM | POA: Diagnosis not present

## 2018-11-25 DIAGNOSIS — Z0000 Encounter for general adult medical examination without abnormal findings: Secondary | ICD-10-CM | POA: Diagnosis not present

## 2018-11-25 DIAGNOSIS — L209 Atopic dermatitis, unspecified: Secondary | ICD-10-CM | POA: Diagnosis not present

## 2018-11-25 DIAGNOSIS — Z6831 Body mass index (BMI) 31.0-31.9, adult: Secondary | ICD-10-CM | POA: Diagnosis not present

## 2018-11-25 DIAGNOSIS — Z712 Person consulting for explanation of examination or test findings: Secondary | ICD-10-CM | POA: Diagnosis not present

## 2018-11-25 DIAGNOSIS — M8580 Other specified disorders of bone density and structure, unspecified site: Secondary | ICD-10-CM | POA: Diagnosis not present

## 2018-11-25 DIAGNOSIS — E669 Obesity, unspecified: Secondary | ICD-10-CM | POA: Diagnosis not present

## 2018-11-30 DIAGNOSIS — Z6831 Body mass index (BMI) 31.0-31.9, adult: Secondary | ICD-10-CM | POA: Diagnosis not present

## 2018-11-30 DIAGNOSIS — M8580 Other specified disorders of bone density and structure, unspecified site: Secondary | ICD-10-CM | POA: Diagnosis not present

## 2018-11-30 DIAGNOSIS — L209 Atopic dermatitis, unspecified: Secondary | ICD-10-CM | POA: Diagnosis not present

## 2018-11-30 DIAGNOSIS — R748 Abnormal levels of other serum enzymes: Secondary | ICD-10-CM | POA: Diagnosis not present

## 2018-11-30 DIAGNOSIS — I10 Essential (primary) hypertension: Secondary | ICD-10-CM | POA: Diagnosis not present

## 2018-11-30 DIAGNOSIS — E669 Obesity, unspecified: Secondary | ICD-10-CM | POA: Diagnosis not present

## 2019-01-18 DIAGNOSIS — M25561 Pain in right knee: Secondary | ICD-10-CM | POA: Diagnosis not present

## 2019-01-18 DIAGNOSIS — Z96652 Presence of left artificial knee joint: Secondary | ICD-10-CM | POA: Diagnosis not present

## 2019-01-18 DIAGNOSIS — Z471 Aftercare following joint replacement surgery: Secondary | ICD-10-CM | POA: Diagnosis not present

## 2019-01-18 DIAGNOSIS — M1711 Unilateral primary osteoarthritis, right knee: Secondary | ICD-10-CM | POA: Diagnosis not present

## 2019-03-21 ENCOUNTER — Ambulatory Visit: Payer: 59 | Attending: Internal Medicine

## 2019-03-21 DIAGNOSIS — Z23 Encounter for immunization: Secondary | ICD-10-CM

## 2019-03-21 NOTE — Progress Notes (Signed)
   Covid-19 Vaccination Clinic  Name:  April Gordon    MRN: [id number] DOB: 1940-07-13  03/21/2019  April Gordon was observed post Covid-19 immunization for 15 minutes without incident. She was provided with Vaccine Information Sheet and instruction to access the V-Safe system.   April Gordon was instructed to call 911 with any severe reactions post vaccine: Marland Kitchen Difficulty breathing  . Swelling of face and throat  . A fast heartbeat  . A bad rash all over body  . Dizziness and weakness   Immunizations Administered    Name Date Dose VIS Date Route   Moderna COVID-19 Vaccine 03/21/2019 10:15 AM 0.5 mL 12/20/2018 Intramuscular   Manufacturer: Moderna   Lot: [id number]   [id number]

## 2019-03-21 NOTE — Progress Notes (Signed)
   Covid-19 Vaccination Clinic  Name:  April Gordon    MRN: [id number] DOB: 1941-01-16  03/21/2019  [name] was observed post Covid-19 immunization for 15 minutes without incident. She was provided with Vaccine Information Sheet and instruction to access the V-Safe system.   [name] was instructed to call 911 with any severe reactions post vaccine: Marland Kitchen Difficulty breathing  . Swelling of face and throat  . A fast heartbeat  . A bad rash all over body  . Dizziness and weakness   Immunizations Administered    Name Date Dose VIS Date Route   Moderna COVID-19 Vaccine 03/21/2019 10:15 AM 0.5 mL 12/20/2018 Intramuscular   Manufacturer: Moderna   Lot: [id number]   [id number]

## 2019-03-25 ENCOUNTER — Ambulatory Visit: Payer: 59

## 2019-04-05 NOTE — H&P (Signed)
TOTAL KNEE ADMISSION H&P  Patient is being admitted for right total knee arthroplasty.  Subjective:  Chief Complaint:    Right knee primary OA / pain  HPI: April Gordon, 79 y.o. female, has a history of pain and functional disability in the right knee due to arthritis and has failed non-surgical conservative treatments for greater than 12 weeks to includeNSAID's and/or analgesics, corticosteriod injections, use of assistive devices and activity modification.  Onset of symptoms was gradual, starting >10 years ago with gradually worsening course since that time. The patient noted prior procedures on the knee to include  arthroplasty on the left knee(s).  Patient currently rates pain in the right knee(s) at 1 out of 10 with activity, more stiffness than pain.  Patient has worsening of pain with activity and weight bearing, pain that interferes with activities of daily living, pain with passive range of motion and crepitus.  Patient has evidence of periarticular osteophytes and joint space narrowing by imaging studies. There is no active infection.  Risks, benefits and expectations were discussed with the patient.  Risks including but not limited to the risk of anesthesia, blood clots, nerve damage, blood vessel damage, failure of the prosthesis, infection and up to and including death.  Patient understand the risks, benefits and expectations and wishes to proceed with surgery.   PCP: Celene Squibb, MD  D/C Plans:       Home   Post-op Meds:       No Rx given   Tranexamic Acid:      To be given - IV   Decadron:      Is to be given  FYI:      ASA  Norco  DME:   Pt already has equipment  PT:   OPPT   Pharmacy: [facility] [id number] [place], [place], [place]    Patient Active Problem List   Diagnosis Date Noted  . Overweight (BMI 25.0-29.9) 05/12/2017  . S/P left TKA 05/11/2017  . 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 Effusion of knee joint, left 07/06/2011  . HYPOKALEMIA 07/18/2008  . OSTEOPENIA  02/18/2007  . ALKALINE PHOSPHATASE, ELEVATED 01/07/2007  . OBESITY 11/30/2006  . HYPERTENSION 11/30/2006  . ARTHRITIS 11/30/2006   Past Medical History:  Diagnosis Date  . Arthritis   . HTN (hypertension)     Past Surgical History:  Procedure Laterality Date  . JOINT REPLACEMENT     Left total knee Dr. Alvan Dame 05-11-17  . TOTAL HIP ARTHROPLASTY Right 06/16/2016   Procedure: RIGHT TOTAL HIP ARTHROPLASTY ANTERIOR APPROACH;  Surgeon: Paralee Cancel, MD;  Location: [facility];  Service: Orthopedics;  Laterality: Right;  . TOTAL KNEE ARTHROPLASTY Left 05/11/2017   Procedure: LEFT TOTAL KNEE ARTHROPLASTY;  Surgeon: Paralee Cancel, MD;  Location: [facility];  Service: Orthopedics;  Laterality: Left;  with block. a failed spinal so general anesthesia was adminstred.     No current facility-administered medications for this encounter.   Current Outpatient Medications  Medication Sig Dispense Refill Last Dose  . bisacodyl (BISACODYL) 5 MG EC tablet Take 5 mg by mouth daily as needed for moderate constipation.     . Cholecalciferol (VITAMIN D PO) Take 1 capsule by mouth daily.     Marland Kitchen docusate sodium (COLACE) 100 MG capsule Take 1 capsule (100 mg total) by mouth 2 (two) times daily. 10 capsule 0   . ferrous sulfate (FERROUSUL) 325 (65 FE) MG tablet Take 1 tablet (325 mg total) by mouth 3 (three) times daily  with meals.  3   . hydrochlorothiazide (HYDRODIURIL) 50 MG tablet Take 50 mg by mouth daily with lunch.      . methocarbamol (ROBAXIN) 500 MG tablet Take 1 tablet (500 mg total) by mouth every 6 (six) hours as needed for muscle spasms. 40 tablet 0   . oxyCODONE (OXY IR/ROXICODONE) 5 MG immediate release tablet Take 1-2 tablets (5-10 mg total) by mouth every 4 (four) hours as needed for moderate pain or severe pain. 60 tablet 0   . polyethylene glycol (MIRALAX / GLYCOLAX) packet Take 17 g by mouth 2 (two) times daily. 14 each 0   . verapamil (CALAN-SR) 240 MG CR tablet Take 240 mg by mouth daily with lunch.       . vitamin B-12 (CYANOCOBALAMIN) 250 MCG tablet Take 250 mcg by mouth daily.     . vitamin C (ASCORBIC ACID) 500 MG tablet Take 500 mg by mouth daily.     . vitamin E 400 UNIT capsule Take 400 Units by mouth daily.      No Known Allergies   Social History   Tobacco Use  . Smoking status: Former Smoker    Years: 6.00  . Smokeless tobacco: Never Used  . Tobacco comment: 40 years ago  Substance Use Topics  . Alcohol use: No    Family History  Problem Relation Age of Onset  . Arthritis Unknown      Review of Systems  Constitutional: Negative.   HENT: Negative.   Eyes: Negative.   Respiratory: Negative.   Cardiovascular: Negative.   Gastrointestinal: Negative.   Genitourinary: Negative.   Musculoskeletal: Positive for joint pain.  Skin: Negative.   Neurological: Negative.   Endo/Heme/Allergies: Negative.   Psychiatric/Behavioral: Negative.      Objective:  Physical Exam  Constitutional: She is oriented to person, place, and time. She appears well-developed.  HENT:  Head: Normocephalic.  Eyes: Pupils are equal, round, and reactive to light.  Neck: No JVD present. No tracheal deviation present. No thyromegaly present.  Cardiovascular: Normal rate, regular rhythm and intact distal pulses.  Respiratory: Effort normal and breath sounds normal. No respiratory distress. She has no wheezes.  GI: Soft. There is no abdominal tenderness. There is no guarding.  Musculoskeletal:     Cervical back: Neck supple.     Right knee: Swelling and bony tenderness present. No deformity, erythema, ecchymosis or lacerations. Decreased range of motion. Tenderness present. Abnormal alignment.  Lymphadenopathy:    She has no cervical adenopathy.  Neurological: She is alert and oriented to person, place, and time.  Skin: Skin is warm and dry.  Psychiatric: She has a normal mood and affect.     Labs:  Estimated body mass index is 29.79 kg/m as calculated from the following:   Height as  of 05/11/17: 5\' 5"  (1.651 m).   Weight as of 05/11/17: 81.2 kg.   Imaging Review Plain radiographs demonstrate severe degenerative joint disease of the right knee. The overall alignment is significant valgus. The bone quality appears to be good for age and reported activity level.      Assessment/Plan:  End stage arthritis, right knee   The patient history, physical examination, clinical judgment of the provider and imaging studies are consistent with end stage degenerative joint disease of the right knee and total knee arthroplasty is deemed medically necessary. The treatment options including medical management, injection therapy arthroscopy and arthroplasty were discussed at length. The risks and benefits of total knee arthroplasty were presented and  reviewed. The risks due to aseptic loosening, infection, stiffness, patella tracking problems, thromboembolic complications and other imponderables were discussed. The patient acknowledged the explanation, agreed to proceed with the plan and consent was signed. Patient is being admitted for inpatient treatment for surgery, pain control, PT, OT, prophylactic antibiotics, VTE prophylaxis, progressive ambulation and ADL's and discharge planning. The patient is planning to be discharged home.     Patient's anticipated LOS is less than 2 midnights, meeting these requirements: - Lives within 1 hour of care - Has a competent adult at home to recover with post-op recover - NO history of  - Chronic pain requiring opiods  - Diabetes  - Coronary Artery Disease  - Heart failure  - Heart attack  - Stroke  - DVT/VTE  - Cardiac arrhythmia  - Respiratory Failure/COPD  - Renal failure  - Anemia  - Advanced Liver disease      [place] Pugh. Joslynn Jamroz   PA-C  04/05/2019, 12:08 PM

## 2019-04-06 NOTE — Patient Instructions (Addendum)
DUE TO COVID-19 ONLY ONE VISITOR IS ALLOWED TO COME WITH YOU AND STAY IN THE WAITING ROOM ONLY DURING PRE OP AND PROCEDURE DAY OF SURGERY. THE 1 VISITOR MAY VISIT WITH YOU AFTER SURGERY IN YOUR PRIVATE ROOM DURING VISITING HOURS ONLY!  10 am- 8pm  YOU NEED TO HAVE A COVID 19 TEST ON_3-26-20______ @___200  pm ____, THIS TEST MUST BE DONE BEFORE SURGERY, COME  To  [facility] hospital  ONCE YOUR COVID TEST IS COMPLETED, PLEASE BEGIN THE QUARANTINE INSTRUCTIONS AS OUTLINED IN YOUR HANDOUT.                April Gordon  04/06/2019   Your procedure is scheduled on:   04-18-19   Report to [facility] Main  Entrance   Report to admitting at     0735 AM     Call this number if you have problems the morning of surgery [phone]    Remember: NO SOLID FOOD AFTER MIDNIGHT THE NIGHT PRIOR TO SURGERY. NOTHING BY MOUTH EXCEPT CLEAR LIQUIDS UNTIL     0700 am  . PLEASE FINISH ENSURE DRINK PER SURGEON ORDER  WHICH NEEDS TO BE COMPLETED AT       0700 am then nothing by mouth.    CLEAR LIQUID DIET   Foods Allowed                                                                                                 Foods Excluded  Coffee and tea, regular and decaf   No creamer                                        liquids that you cannot  Plain Jell-O any favor except red or purple                                                 see through such as: Fruit ices (not with fruit pulp)                                                                         milk, soups, orange juice  Iced Popsicles                                                                     All solid food Carbonated beverages, regular and diet  Cranberry, grape and apple juices Sports drinks like Gatorade Lightly seasoned clear broth or consume(fat free) Sugar, honey syrup                                              BRUSH YOUR TEETH MORNING OF SURGERY AND RINSE YOUR MOUTH OUT, NO CHEWING GUM  CANDY OR MINTS.     Take these medicines the morning of surgery with A SIP OF WATER    Or ensure drink: verapamil                                 You may not have any metal on your body including hair pins and              piercings  Do not wear jewelry, make-up, lotions, powders or perfumes, deodorant             Do not wear nail polish on your fingernails.  Do not shave  48 hours prior to surgery.             Do not bring valuables to the hospital. [place].  Contacts, dentures or bridgework may not be worn into surgery.      Patients discharged the day of surgery will not be allowed to drive home. IF YOU ARE HAVING SURGERY AND GOING HOME THE SAME DAY, YOU MUST HAVE AN ADULT TO DRIVE YOU HOME AND BE WITH YOU FOR 24 HOURS. YOU MAY GO HOME BY TAXI OR UBER OR ORTHERWISE, BUT AN ADULT MUST ACCOMPANY YOU HOME AND STAY WITH YOU FOR 24 HOURS.  Name and phone number of your driver:  Special Instructions: N/A              Please read over the following fact sheets you were given: _____________________________________________________________________             [facility] - Preparing for Surgery Before surgery, you can play an important role.  Because skin is not sterile, your skin needs to be as free of germs as possible.  You can reduce the number of germs on your skin by washing with CHG (chlorahexidine gluconate) soap before surgery.  CHG is an antiseptic cleaner which kills germs and bonds with the skin to continue killing germs even after washing. Please DO NOT use if you have an allergy to CHG or antibacterial soaps.  If your skin becomes reddened/irritated stop using the CHG and inform your nurse when you arrive at Short Stay. Do not shave (including legs and underarms) for at least 48 hours prior to the first CHG shower.  You may shave your face/neck. Please follow these instructions carefully:  1.  Shower with CHG Soap the night  before surgery and the  morning of Surgery.  2.  If you choose to wash your hair, wash your hair first as usual with your  normal  shampoo.  3.  After you shampoo, rinse your hair and body thoroughly to remove the  shampoo.                           4.  Use CHG as you would any other  liquid soap.  You can apply chg directly  to the skin and wash                       Gently with a scrungie or clean washcloth.  5.  Apply the CHG Soap to your body ONLY FROM THE NECK DOWN.   Do not use on face/ open                           Wound or open sores. Avoid contact with eyes, ears mouth and genitals (private parts).                       Wash face,  Genitals (private parts) with your normal soap.             6.  Wash thoroughly, paying special attention to the area where your surgery  will be performed.  7.  Thoroughly rinse your body with warm water from the neck down.  8.  DO NOT shower/wash with your normal soap after using and rinsing off  the CHG Soap.                9.  Pat yourself dry with a clean towel.            10.  Wear clean pajamas.            11.  Place clean sheets on your bed the night of your first shower and do not  sleep with pets. Day of Surgery : Do not apply any lotions/deodorants the morning of surgery.  Please wear clean clothes to the hospital/surgery center.  FAILURE TO FOLLOW THESE INSTRUCTIONS MAY RESULT IN THE CANCELLATION OF YOUR SURGERY PATIENT SIGNATURE_________________________________  NURSE SIGNATURE__________________________________  ________________________________________________________________________   [name]  An incentive spirometer is a tool that can help keep your lungs clear and active. This tool measures how well you are filling your lungs with each breath. Taking long deep breaths may help reverse or decrease the chance of developing breathing (pulmonary) problems (especially infection) following:  A long period of time when you are  unable to move or be active. BEFORE THE PROCEDURE   If the spirometer includes an indicator to show your best effort, your nurse or respiratory therapist will set it to a desired goal.  If possible, sit up straight or lean slightly forward. Try not to slouch.  Hold the incentive spirometer in an upright position. INSTRUCTIONS FOR USE  1. Sit on the edge of your bed if possible, or sit up as far as you can in bed or on a chair. 2. Hold the incentive spirometer in an upright position. 3. Breathe out normally. 4. Place the mouthpiece in your mouth and seal your lips tightly around it. 5. Breathe in slowly and as deeply as possible, raising the piston or the ball toward the top of the column. 6. Hold your breath for 3-5 seconds or for as long as possible. Allow the piston or ball to fall to the bottom of the column. 7. Remove the mouthpiece from your mouth and breathe out normally. 8. Rest for a few seconds and repeat Steps 1 through 7 at least 10 times every 1-2 hours when you are awake. Take your time and take a few normal breaths between deep breaths. 9. The spirometer may include an indicator to show your best effort. Use the indicator as a goal  to work toward during each repetition. 10. After each set of 10 deep breaths, practice coughing to be sure your lungs are clear. If you have an incision (the cut made at the time of surgery), support your incision when coughing by placing a pillow or rolled up towels firmly against it. Once you are able to get out of bed, walk around indoors and cough well. You may stop using the incentive spirometer when instructed by your caregiver.  RISKS AND COMPLICATIONS  Take your time so you do not get dizzy or light-headed.  If you are in pain, you may need to take or ask for pain medication before doing incentive spirometry. It is harder to take a deep breath if you are having pain. AFTER USE  Rest and breathe slowly and easily.  It can be helpful to  keep track of a log of your progress. Your caregiver can provide you with a simple table to help with this. If you are using the spirometer at home, follow these instructions: [place] IF:   You are having difficultly using the spirometer.  You have trouble using the spirometer as often as instructed.  Your pain medication is not giving enough relief while using the spirometer.  You develop fever of 100.5 F (38.1 C) or higher. SEEK IMMEDIATE MEDICAL CARE IF:   You cough up bloody sputum that had not been present before.  You develop fever of 102 F (38.9 C) or greater.  You develop worsening pain at or near the incision site. MAKE SURE YOU:   Understand these instructions.  Will watch your condition.  Will get help right away if you are not doing well or get worse. Document Released: 05/18/2006 Document Revised: 03/30/2011 Document Reviewed: 07/19/2006 Exit[employer] Patient Information 2014 [employer], [place].   ________________________________________________________________________  WHAT IS A BLOOD TRANSFUSION? Blood Transfusion Information  A transfusion is the replacement of blood or some of its parts. Blood is made up of multiple cells which provide different functions.  Red blood cells carry oxygen and are used for blood loss replacement.  White blood cells fight against infection.  Platelets control bleeding.  Plasma helps clot blood.  Other blood products are available for specialized needs, such as hemophilia or other clotting disorders. BEFORE THE TRANSFUSION  Who gives blood for transfusions?   Healthy volunteers who are fully evaluated to make sure their blood is safe. This is blood bank blood. Transfusion therapy is the safest it has ever been in the practice of medicine. Before blood is taken from a donor, a complete history is taken to make sure that person has no history of diseases nor engages in risky social behavior (examples are intravenous drug  use or sexual activity with multiple partners). The donor's travel history is screened to minimize risk of transmitting infections, such as malaria. The donated blood is tested for signs of infectious diseases, such as HIV and hepatitis. The blood is then tested to be sure it is compatible with you in order to minimize the chance of a transfusion reaction. If you or a relative donates blood, this is often done in anticipation of surgery and is not appropriate for emergency situations. It takes many days to process the donated blood. RISKS AND COMPLICATIONS Although transfusion therapy is very safe and saves many lives, the main dangers of transfusion include:   Getting an infectious disease.  Developing a transfusion reaction. This is an allergic reaction to something in the blood you were given. Every precaution  is taken to prevent this. The decision to have a blood transfusion has been considered carefully by your caregiver before blood is given. Blood is not given unless the benefits outweigh the risks. AFTER THE TRANSFUSION  Right after receiving a blood transfusion, you will usually feel much better and more energetic. This is especially true if your red blood cells have gotten low (anemic). The transfusion raises the level of the red blood cells which carry oxygen, and this usually causes an energy increase.  The nurse administering the transfusion will monitor you carefully for complications. HOME CARE INSTRUCTIONS  No special instructions are needed after a transfusion. You may find your energy is better. Speak with your caregiver about any limitations on activity for underlying diseases you may have. SEEK MEDICAL CARE IF:   Your condition is not improving after your transfusion.  You develop redness or irritation at the intravenous (IV) site. SEEK IMMEDIATE MEDICAL CARE IF:  Any of the following symptoms occur over the next 12 hours:  Shaking chills.  You have a temperature by mouth  above 102 F (38.9 C), not controlled by medicine.  Chest, back, or muscle pain.  People around you feel you are not acting correctly or are confused.  Shortness of breath or difficulty breathing.  Dizziness and fainting.  You get a rash or develop hives.  You have a decrease in urine output.  Your urine turns a dark color or changes to pink, red, or brown. Any of the following symptoms occur over the next 10 days:  You have a temperature by mouth above 102 F (38.9 C), not controlled by medicine.  Shortness of breath.  Weakness after normal activity.  The white part of the eye turns yellow (jaundice).  You have a decrease in the amount of urine or are urinating less often.  Your urine turns a dark color or changes to pink, red, or brown. Document Released: 01/03/2000 Document Revised: 03/30/2011 Document Reviewed: 08/22/2007 [facility] Patient Information 2014 [employer], [place].  _______________________________________________________________________

## 2019-04-12 ENCOUNTER — Encounter (HOSPITAL_COMMUNITY)
Admission: RE | Admit: 2019-04-12 | Discharge: 2019-04-12 | Disposition: A | Discharge status: Home / Self Care | Payer: Medicare HMO | Source: Ambulatory Visit | Attending: Orthopedic Surgery | Admitting: Orthopedic Surgery

## 2019-04-12 ENCOUNTER — Other Ambulatory Visit: Payer: Self-pay

## 2019-04-12 ENCOUNTER — Encounter (HOSPITAL_COMMUNITY): Payer: Self-pay

## 2019-04-12 DIAGNOSIS — Z01818 Encounter for other preprocedural examination: Secondary | ICD-10-CM | POA: Diagnosis not present

## 2019-04-12 HISTORY — DX: Family history of other specified conditions: Z84.89

## 2019-04-12 LAB — CBC
HCT: 43.1 % (ref 36.0–46.0)
Hemoglobin: 13.7 g/dL (ref 12.0–15.0)
MCH: 30.3 pg (ref 26.0–34.0)
MCHC: 31.8 g/dL (ref 30.0–36.0)
MCV: 95.4 fL (ref 80.0–100.0)
Platelets: 266 10*3/uL (ref 150–400)
RBC: 4.52 MIL/uL (ref 3.87–5.11)
RDW: 12.7 % (ref 11.5–15.5)
WBC: 8.7 10*3/uL (ref 4.0–10.5)
nRBC: 0 % (ref 0.0–0.2)

## 2019-04-12 LAB — BASIC METABOLIC PANEL
Anion gap: 7 (ref 5–15)
BUN: 15 mg/dL (ref 8–23)
CO2: 31 mmol/L (ref 22–32)
Calcium: 9.9 mg/dL (ref 8.9–10.3)
Chloride: 103 mmol/L (ref 98–111)
Creatinine, Ser: 0.84 mg/dL (ref 0.44–1.00)
GFR calc Af Amer: >60 mL/min (ref >60)
GFR calc non Af Amer: >60 mL/min (ref >60)
Glucose, Bld: 104 mg/dL — ABNORMAL HIGH (ref 70–99)
Potassium: 4.6 mmol/L (ref 3.5–5.1)
Sodium: 141 mmol/L (ref 135–145)

## 2019-04-12 LAB — SURGICAL PCR SCREEN
MRSA, PCR: NEGATIVE
Staphylococcus aureus: NEGATIVE

## 2019-04-12 NOTE — Progress Notes (Signed)
PCP - Allyn Kenner Cardiologist -   Chest x-ray -  EKG - 04-12-18 epic  Stress Test -  ECHO -  Cardiac Cath -   Sleep Study -  CPAP -   Fasting Blood Sugar -  Checks Blood Sugar _____ times a day  Blood Thinner Instructions: Aspirin Instructions: Last Dose:  Anesthesia review:   Patient denies shortness of breath, fever, cough and chest pain at PAT appointment   NONE   Patient verbalized understanding of instructions that were given to them at the PAT appointment. Patient was also instructed that they will need to review over the PAT instructions again at home before surgery.

## 2019-04-14 ENCOUNTER — Other Ambulatory Visit: Payer: Self-pay

## 2019-04-14 ENCOUNTER — Other Ambulatory Visit (HOSPITAL_COMMUNITY)
Admission: RE | Admit: 2019-04-14 | Discharge: 2019-04-14 | Disposition: A | Discharge status: Home / Self Care | Payer: Medicare HMO | Source: Ambulatory Visit | Attending: Orthopedic Surgery | Admitting: Orthopedic Surgery

## 2019-04-14 DIAGNOSIS — Z20822 Contact with and (suspected) exposure to covid-19: Secondary | ICD-10-CM | POA: Insufficient documentation

## 2019-04-14 DIAGNOSIS — Z01812 Encounter for preprocedural laboratory examination: Secondary | ICD-10-CM | POA: Insufficient documentation

## 2019-04-15 LAB — SARS CORONAVIRUS 2 (TAT 6-24 HRS): SARS Coronavirus 2: NEGATIVE

## 2019-04-18 ENCOUNTER — Encounter (HOSPITAL_COMMUNITY): Payer: Self-pay | Admitting: Orthopedic Surgery

## 2019-04-18 ENCOUNTER — Encounter (HOSPITAL_COMMUNITY)
Admission: RE | Disposition: A | Discharge status: Home / Self Care | Payer: Self-pay | Source: Home / Self Care | Attending: Orthopedic Surgery

## 2019-04-18 ENCOUNTER — Ambulatory Visit (HOSPITAL_COMMUNITY): Payer: Medicare HMO | Admitting: Certified Registered Nurse Anesthetist

## 2019-04-18 ENCOUNTER — Observation Stay (HOSPITAL_COMMUNITY)
Admission: RE | Admit: 2019-04-18 | Discharge: 2019-04-19 | Disposition: A | Discharge status: Home / Self Care | Payer: Medicare HMO | Attending: Orthopedic Surgery | Admitting: Orthopedic Surgery

## 2019-04-18 ENCOUNTER — Other Ambulatory Visit: Payer: Self-pay

## 2019-04-18 DIAGNOSIS — Z87891 Personal history of nicotine dependence: Secondary | ICD-10-CM | POA: Insufficient documentation

## 2019-04-18 DIAGNOSIS — G8918 Other acute postprocedural pain: Secondary | ICD-10-CM | POA: Diagnosis not present

## 2019-04-18 DIAGNOSIS — Z96653 Presence of artificial knee joint, bilateral: Secondary | ICD-10-CM | POA: Diagnosis not present

## 2019-04-18 DIAGNOSIS — Z96651 Presence of right artificial knee joint: Secondary | ICD-10-CM

## 2019-04-18 DIAGNOSIS — Z96641 Presence of right artificial hip joint: Secondary | ICD-10-CM | POA: Diagnosis not present

## 2019-04-18 DIAGNOSIS — I10 Essential (primary) hypertension: Secondary | ICD-10-CM | POA: Insufficient documentation

## 2019-04-18 DIAGNOSIS — M1711 Unilateral primary osteoarthritis, right knee: Principal | ICD-10-CM | POA: Insufficient documentation

## 2019-04-18 HISTORY — PX: TOTAL KNEE ARTHROPLASTY: SHX125

## 2019-04-18 LAB — TYPE AND SCREEN
ABO/RH(D): O POS
Antibody Screen: NEGATIVE

## 2019-04-18 SURGERY — ARTHROPLASTY, KNEE, TOTAL
Anesthesia: Spinal | Site: Knee | Laterality: Right

## 2019-04-18 MED ORDER — KETOROLAC TROMETHAMINE 30 MG/ML IJ SOLN
INTRAMUSCULAR | Status: AC
  Filled 2019-04-18: qty 1

## 2019-04-18 MED ORDER — ONDANSETRON HCL 4 MG/2ML IJ SOLN: 4.0000 mg | Freq: Four times a day (QID) | INTRAMUSCULAR | Status: DC | PRN

## 2019-04-18 MED ORDER — FERROUS SULFATE 325 (65 FE) MG PO TABS
325.0000 mg | ORAL_TABLET | Freq: Two times a day (BID) | ORAL | Status: DC
  Administered 2019-04-18 – 2019-04-19 (×2): 325 mg via ORAL
  Filled 2019-04-18 (×2): qty 1

## 2019-04-18 MED ORDER — ONDANSETRON HCL 4 MG/2ML IJ SOLN
INTRAMUSCULAR | Status: AC
  Filled 2019-04-18: qty 2

## 2019-04-18 MED ORDER — POVIDONE-IODINE 10 % EX SWAB
2.0000 "application " | Freq: Once | CUTANEOUS | Status: AC
  Administered 2019-04-18: 2 via TOPICAL

## 2019-04-18 MED ORDER — BISACODYL 10 MG RE SUPP: 10.0000 mg | Freq: Every day | RECTAL | Status: DC | PRN

## 2019-04-18 MED ORDER — FENTANYL CITRATE (PF) 100 MCG/2ML IJ SOLN
50.0000 ug | INTRAMUSCULAR | Status: DC
  Administered 2019-04-18: 09:00:00 25 ug via INTRAVENOUS
  Filled 2019-04-18: qty 2

## 2019-04-18 MED ORDER — HYDROMORPHONE HCL 1 MG/ML IJ SOLN: 0.5000 mg | INTRAMUSCULAR | Status: DC | PRN

## 2019-04-18 MED ORDER — LIDOCAINE 2% (20 MG/ML) 5 ML SYRINGE
INTRAMUSCULAR | Status: AC
  Filled 2019-04-18: qty 5

## 2019-04-18 MED ORDER — HYDROCHLOROTHIAZIDE 25 MG PO TABS
50.0000 mg | ORAL_TABLET | Freq: Every day | ORAL | Status: DC
  Administered 2019-04-18: 50 mg via ORAL
  Filled 2019-04-18: qty 2

## 2019-04-18 MED ORDER — BUPIVACAINE HCL (PF) 0.25 % IJ SOLN
INTRAMUSCULAR | Status: AC
  Filled 2019-04-18: qty 30

## 2019-04-18 MED ORDER — BUPIVACAINE IN DEXTROSE 0.75-8.25 % IT SOLN
INTRATHECAL | Status: DC | PRN
  Administered 2019-04-18: 1.8 mL via INTRATHECAL

## 2019-04-18 MED ORDER — PHENOL 1.4 % MT LIQD: 1.0000 | OROMUCOSAL | Status: DC | PRN

## 2019-04-18 MED ORDER — CELECOXIB 200 MG PO CAPS
200.0000 mg | ORAL_CAPSULE | Freq: Two times a day (BID) | ORAL | Status: DC
  Administered 2019-04-18 – 2019-04-19 (×2): 200 mg via ORAL
  Filled 2019-04-18 (×2): qty 1

## 2019-04-18 MED ORDER — LIDOCAINE HCL (CARDIAC) PF 100 MG/5ML IV SOSY
PREFILLED_SYRINGE | INTRAVENOUS | Status: DC | PRN
  Administered 2019-04-18: 80 mg via INTRAVENOUS

## 2019-04-18 MED ORDER — METHOCARBAMOL 500 MG IVPB - SIMPLE MED
500.0000 mg | Freq: Four times a day (QID) | INTRAVENOUS | Status: DC | PRN
  Filled 2019-04-18: qty 50

## 2019-04-18 MED ORDER — PROPOFOL 10 MG/ML IV BOLUS
INTRAVENOUS | Status: DC | PRN
  Administered 2019-04-18: 30 mg via INTRAVENOUS

## 2019-04-18 MED ORDER — VERAPAMIL HCL ER 240 MG PO TBCR
240.0000 mg | EXTENDED_RELEASE_TABLET | Freq: Every day | ORAL | Status: DC
  Filled 2019-04-18: qty 1

## 2019-04-18 MED ORDER — LACTATED RINGERS IV SOLN: INTRAVENOUS | Status: DC | PRN

## 2019-04-18 MED ORDER — ONDANSETRON HCL 4 MG/2ML IJ SOLN
INTRAMUSCULAR | Status: DC | PRN
  Administered 2019-04-18: 4 mg via INTRAVENOUS

## 2019-04-18 MED ORDER — SODIUM CHLORIDE (PF) 0.9 % IJ SOLN
INTRAMUSCULAR | Status: DC | PRN
  Administered 2019-04-18: 30 mL

## 2019-04-18 MED ORDER — PHENYLEPHRINE HCL-NACL 10-0.9 MG/250ML-% IV SOLN
INTRAVENOUS | Status: DC | PRN
  Administered 2019-04-18: 50 ug/min via INTRAVENOUS

## 2019-04-18 MED ORDER — OXYCODONE HCL 5 MG PO TABS
5.0000 mg | ORAL_TABLET | ORAL | Status: DC | PRN
  Administered 2019-04-19: 5 mg via ORAL
  Filled 2019-04-18 (×2): qty 1

## 2019-04-18 MED ORDER — MENTHOL 3 MG MT LOZG: 1.0000 | LOZENGE | OROMUCOSAL | Status: DC | PRN

## 2019-04-18 MED ORDER — DEXAMETHASONE SODIUM PHOSPHATE 10 MG/ML IJ SOLN
INTRAMUSCULAR | Status: AC
  Filled 2019-04-18: qty 1

## 2019-04-18 MED ORDER — SODIUM CHLORIDE 0.9 % IV SOLN: INTRAVENOUS | Status: DC

## 2019-04-18 MED ORDER — TRANEXAMIC ACID-NACL 1000-0.7 MG/100ML-% IV SOLN
1000.0000 mg | INTRAVENOUS | Status: AC
  Administered 2019-04-18: 1000 mg via INTRAVENOUS
  Filled 2019-04-18: qty 100

## 2019-04-18 MED ORDER — TRANEXAMIC ACID-NACL 1000-0.7 MG/100ML-% IV SOLN
1000.0000 mg | Freq: Once | INTRAVENOUS | Status: AC
  Administered 2019-04-18: 14:00:00 1000 mg via INTRAVENOUS
  Filled 2019-04-18: qty 100

## 2019-04-18 MED ORDER — CEFAZOLIN SODIUM-DEXTROSE 2-4 GM/100ML-% IV SOLN
2.0000 g | Freq: Four times a day (QID) | INTRAVENOUS | Status: AC
  Administered 2019-04-18 (×2): 2 g via INTRAVENOUS
  Filled 2019-04-18 (×2): qty 100

## 2019-04-18 MED ORDER — MAGNESIUM CITRATE PO SOLN: 1.0000 | Freq: Once | ORAL | Status: DC | PRN

## 2019-04-18 MED ORDER — CEFAZOLIN SODIUM-DEXTROSE 2-4 GM/100ML-% IV SOLN
2.0000 g | INTRAVENOUS | Status: AC
  Administered 2019-04-18: 2 g via INTRAVENOUS
  Filled 2019-04-18: qty 100

## 2019-04-18 MED ORDER — METOCLOPRAMIDE HCL 5 MG PO TABS: 5.0000 mg | ORAL_TABLET | Freq: Three times a day (TID) | ORAL | Status: DC | PRN

## 2019-04-18 MED ORDER — DEXAMETHASONE SODIUM PHOSPHATE 10 MG/ML IJ SOLN
10.0000 mg | Freq: Once | INTRAMUSCULAR | Status: AC
  Administered 2019-04-19: 08:00:00 10 mg via INTRAVENOUS
  Filled 2019-04-18: qty 1

## 2019-04-18 MED ORDER — LACTATED RINGERS IV SOLN: INTRAVENOUS | Status: DC

## 2019-04-18 MED ORDER — POLYETHYLENE GLYCOL 3350 17 G PO PACK
17.0000 g | PACK | Freq: Two times a day (BID) | ORAL | Status: DC
  Administered 2019-04-18 – 2019-04-19 (×2): 17 g via ORAL
  Filled 2019-04-18 (×2): qty 1

## 2019-04-18 MED ORDER — DEXAMETHASONE SODIUM PHOSPHATE 10 MG/ML IJ SOLN: 10.0000 mg | Freq: Once | INTRAMUSCULAR | Status: DC

## 2019-04-18 MED ORDER — ONDANSETRON HCL 4 MG PO TABS: 4.0000 mg | ORAL_TABLET | Freq: Four times a day (QID) | ORAL | Status: DC | PRN

## 2019-04-18 MED ORDER — ASPIRIN 81 MG PO CHEW
81.0000 mg | CHEWABLE_TABLET | Freq: Two times a day (BID) | ORAL | Status: DC
  Administered 2019-04-18 – 2019-04-19 (×2): 81 mg via ORAL
  Filled 2019-04-18 (×2): qty 1

## 2019-04-18 MED ORDER — 0.9 % SODIUM CHLORIDE (POUR BTL) OPTIME
TOPICAL | Status: DC | PRN
  Administered 2019-04-18: 1000 mL

## 2019-04-18 MED ORDER — SODIUM CHLORIDE (PF) 0.9 % IJ SOLN
INTRAMUSCULAR | Status: AC
  Filled 2019-04-18: qty 50

## 2019-04-18 MED ORDER — DIPHENHYDRAMINE HCL 12.5 MG/5ML PO ELIX: 12.5000 mg | ORAL_SOLUTION | ORAL | Status: DC | PRN

## 2019-04-18 MED ORDER — DOCUSATE SODIUM 100 MG PO CAPS
100.0000 mg | ORAL_CAPSULE | Freq: Two times a day (BID) | ORAL | Status: DC
  Administered 2019-04-18 – 2019-04-19 (×2): 100 mg via ORAL
  Filled 2019-04-18 (×2): qty 1

## 2019-04-18 MED ORDER — METHOCARBAMOL 500 MG PO TABS
500.0000 mg | ORAL_TABLET | Freq: Four times a day (QID) | ORAL | Status: DC | PRN
  Administered 2019-04-18: 500 mg via ORAL
  Filled 2019-04-18: qty 1

## 2019-04-18 MED ORDER — ALUM & MAG HYDROXIDE-SIMETH 200-200-20 MG/5ML PO SUSP: 15.0000 mL | ORAL | Status: DC | PRN

## 2019-04-18 MED ORDER — BUPIVACAINE HCL (PF) 0.25 % IJ SOLN
INTRAMUSCULAR | Status: DC | PRN
  Administered 2019-04-18: 30 mL

## 2019-04-18 MED ORDER — OXYCODONE HCL 5 MG PO TABS: 10.0000 mg | ORAL_TABLET | ORAL | Status: DC | PRN

## 2019-04-18 MED ORDER — ROPIVACAINE HCL 7.5 MG/ML IJ SOLN
INTRAMUSCULAR | Status: DC | PRN
  Administered 2019-04-18: 20 mL via PERINEURAL

## 2019-04-18 MED ORDER — ACETAMINOPHEN 325 MG PO TABS
325.0000 mg | ORAL_TABLET | Freq: Four times a day (QID) | ORAL | Status: DC | PRN
  Administered 2019-04-18 – 2019-04-19 (×2): 650 mg via ORAL
  Filled 2019-04-18 (×2): qty 2

## 2019-04-18 MED ORDER — DEXAMETHASONE SODIUM PHOSPHATE 10 MG/ML IJ SOLN
INTRAMUSCULAR | Status: DC | PRN
  Administered 2019-04-18: 10 mg via INTRAVENOUS

## 2019-04-18 MED ORDER — KETOROLAC TROMETHAMINE 30 MG/ML IJ SOLN
INTRAMUSCULAR | Status: DC | PRN
  Administered 2019-04-18: 30 mg

## 2019-04-18 MED ORDER — CHLORHEXIDINE GLUCONATE 4 % EX LIQD: 60.0000 mL | Freq: Once | CUTANEOUS | Status: DC

## 2019-04-18 MED ORDER — PROPOFOL 500 MG/50ML IV EMUL
INTRAVENOUS | Status: DC | PRN
  Administered 2019-04-18: 75 ug/kg/min via INTRAVENOUS

## 2019-04-18 MED ORDER — METOCLOPRAMIDE HCL 5 MG/ML IJ SOLN: 5.0000 mg | Freq: Three times a day (TID) | INTRAMUSCULAR | Status: DC | PRN

## 2019-04-18 SURGICAL SUPPLY — 66 items
ADH SKN CLS APL DERMABOND .7 (GAUZE/BANDAGES/DRESSINGS) ×1
ATTUNE MED ANAT PAT 38 KNEE (Knees) ×1 IMPLANT
ATTUNE PS FEM RT SZ 5 CEM KNEE (Femur) ×1 IMPLANT
ATTUNE PSRP INSE SZ5 7 KNEE (Insert) ×1 IMPLANT
BAG SPEC THK2 15X12 ZIP CLS (MISCELLANEOUS)
BAG ZIPLOCK 12X15 (MISCELLANEOUS) IMPLANT
BASE TIBIA ATTUNE KNEE SYS SZ6 (Knees) IMPLANT
BLADE SAW SGTL 11.0X1.19X90.0M (BLADE) IMPLANT
BLADE SAW SGTL 13.0X1.19X90.0M (BLADE) ×2 IMPLANT
BLADE SURG SZ10 CARB STEEL (BLADE) ×4 IMPLANT
BNDG CMPR MED 15X6 ELC VLCR LF (GAUZE/BANDAGES/DRESSINGS) ×1
BNDG ELASTIC 6X15 VLCR STRL LF (GAUZE/BANDAGES/DRESSINGS) ×1 IMPLANT
BNDG ELASTIC 6X5.8 VLCR STR LF (GAUZE/BANDAGES/DRESSINGS) ×2 IMPLANT
BOWL SMART MIX CTS (DISPOSABLE) ×2 IMPLANT
BSPLAT TIB 6 CMNT ROT PLAT STR (Knees) ×1 IMPLANT
CEMENT HV SMART SET (Cement) ×1 IMPLANT
COVER SURGICAL LIGHT HANDLE (MISCELLANEOUS) ×2 IMPLANT
COVER WAND RF STERILE (DRAPES) IMPLANT
CUFF TOURN SGL QUICK 34 (TOURNIQUET CUFF) ×2
CUFF TRNQT CYL 34X4.125X (TOURNIQUET CUFF) ×1 IMPLANT
DECANTER SPIKE VIAL GLASS SM (MISCELLANEOUS) ×4 IMPLANT
DERMABOND ADVANCED (GAUZE/BANDAGES/DRESSINGS) ×1
DERMABOND ADVANCED .7 DNX12 (GAUZE/BANDAGES/DRESSINGS) ×1 IMPLANT
DRAPE U-SHAPE 47X51 STRL (DRAPES) ×2 IMPLANT
DRESSING AQUACEL AG SP 3.5X10 (GAUZE/BANDAGES/DRESSINGS) ×1 IMPLANT
DRSG AQUACEL AG ADV 3.5X10 (GAUZE/BANDAGES/DRESSINGS) ×1 IMPLANT
DRSG AQUACEL AG SP 3.5X10 (GAUZE/BANDAGES/DRESSINGS) ×2
DURAPREP 26ML APPLICATOR (WOUND CARE) ×4 IMPLANT
ELECT REM PT RETURN 15FT ADLT (MISCELLANEOUS) ×2 IMPLANT
GLOVE BIO SURGEON STRL SZ 6 (GLOVE) ×2 IMPLANT
GLOVE BIOGEL PI IND STRL 6.5 (GLOVE) ×1 IMPLANT
GLOVE BIOGEL PI IND STRL 7.5 (GLOVE) ×1 IMPLANT
GLOVE BIOGEL PI IND STRL 8.5 (GLOVE) ×1 IMPLANT
GLOVE BIOGEL PI INDICATOR 6.5 (GLOVE) ×1
GLOVE BIOGEL PI INDICATOR 7.5 (GLOVE) ×1
GLOVE BIOGEL PI INDICATOR 8.5 (GLOVE) ×1
GLOVE ECLIPSE 8.0 STRL XLNG CF (GLOVE) ×2 IMPLANT
GLOVE ORTHO TXT STRL SZ7.5 (GLOVE) ×2 IMPLANT
GOWN STRL REUS W/ TWL LRG LVL3 (GOWN DISPOSABLE) ×1 IMPLANT
GOWN STRL REUS W/TWL 2XL LVL3 (GOWN DISPOSABLE) ×2 IMPLANT
GOWN STRL REUS W/TWL LRG LVL3 (GOWN DISPOSABLE) ×4 IMPLANT
HANDPIECE INTERPULSE COAX TIP (DISPOSABLE) ×2
HOLDER FOLEY CATH W/STRAP (MISCELLANEOUS) IMPLANT
KIT TURNOVER KIT A (KITS) IMPLANT
MANIFOLD NEPTUNE II (INSTRUMENTS) ×2 IMPLANT
NDL SAFETY ECLIPSE 18X1.5 (NEEDLE) IMPLANT
NEEDLE HYPO 18GX1.5 SHARP (NEEDLE)
NS IRRIG 1000ML POUR BTL (IV SOLUTION) ×2 IMPLANT
PACK TOTAL KNEE CUSTOM (KITS) ×2 IMPLANT
PENCIL SMOKE EVACUATOR (MISCELLANEOUS) IMPLANT
PIN DRILL FIX HALF THREAD (BIT) ×1 IMPLANT
PIN FIX SIGMA LCS THRD HI (PIN) ×1 IMPLANT
PROTECTOR NERVE ULNAR (MISCELLANEOUS) ×2 IMPLANT
SET HNDPC FAN SPRY TIP SCT (DISPOSABLE) ×1 IMPLANT
SET PAD KNEE POSITIONER (MISCELLANEOUS) ×2 IMPLANT
SUT MNCRL AB 4-0 PS2 18 (SUTURE) ×2 IMPLANT
SUT STRATAFIX PDS+ 0 24IN (SUTURE) ×2 IMPLANT
SUT VIC AB 1 CT1 36 (SUTURE) ×2 IMPLANT
SUT VIC AB 2-0 CT1 27 (SUTURE) ×6
SUT VIC AB 2-0 CT1 TAPERPNT 27 (SUTURE) ×3 IMPLANT
SYR 3ML LL SCALE MARK (SYRINGE) ×2 IMPLANT
TIBIA ATTUNE KNEE SYS BASE SZ6 (Knees) ×2 IMPLANT
TRAY FOLEY MTR SLVR 16FR STAT (SET/KITS/TRAYS/PACK) ×2 IMPLANT
WATER STERILE IRR 1000ML POUR (IV SOLUTION) ×4 IMPLANT
WRAP KNEE MAXI GEL POST OP (GAUZE/BANDAGES/DRESSINGS) ×2 IMPLANT
YANKAUER SUCT BULB TIP 10FT TU (MISCELLANEOUS) ×2 IMPLANT

## 2019-04-18 NOTE — Interval H&P Note (Signed)
History and Physical Interval Note:  04/18/2019 9:17 AM  April Gordon  has presented today for surgery, with the diagnosis of Right knee osteoarthritis.  The various methods of treatment have been discussed with the patient and family. After consideration of risks, benefits and other options for treatment, the patient has consented to  Procedure(s) with comments: TOTAL KNEE ARTHROPLASTY (Right) - 70 mins as a surgical intervention.  The patient's history has been reviewed, patient examined, no change in status, stable for surgery.  I have reviewed the patient's chart and labs.  Questions were answered to the patient's satisfaction.     [name]

## 2019-04-18 NOTE — Discharge Instructions (Signed)

## 2019-04-18 NOTE — Anesthesia Procedure Notes (Signed)
Spinal  Start time: 04/18/2019 10:19 AM End time: 04/18/2019 10:21 AM Staffing Performed: anesthesiologist  Anesthesiologist: Effie Berkshire, MD Preanesthetic Checklist Completed: patient identified, IV checked, site marked, risks and benefits discussed, surgical consent, monitors and equipment checked, pre-op evaluation and timeout performed Spinal Block Patient position: sitting Prep: DuraPrep and site prepped and draped Location: L3-4 Injection technique: single-shot Needle Needle type: Pencan  Needle gauge: 24 G Needle length: 10 cm Needle insertion depth: 10 cm Additional Notes Patient tolerated well. No immediate complications.

## 2019-04-18 NOTE — Anesthesia Postprocedure Evaluation (Signed)
Anesthesia Post Note  Patient: April Gordon  Procedure(s) Performed: TOTAL KNEE ARTHROPLASTY (Right Knee)     Patient location during evaluation: PACU Anesthesia Type: Spinal Level of consciousness: oriented and awake and alert Pain management: pain level controlled Vital Signs Assessment: post-procedure vital signs reviewed and stable Respiratory status: spontaneous breathing, respiratory function stable and patient connected to nasal cannula oxygen Cardiovascular status: blood pressure returned to baseline and stable Postop Assessment: no headache, no backache and no apparent nausea or vomiting Anesthetic complications: no    Last Vitals:  Vitals:   04/18/19 1330 04/18/19 1351  BP: 118/66 125/76  Pulse: 70 (!) 56  Resp: 11 18  Temp: 36.7 C (!) 36.4 C  SpO2: 100% 100%    Last Pain:  Vitals:   04/18/19 1330  TempSrc:   PainSc: Asleep                 Effie Berkshire

## 2019-04-18 NOTE — Anesthesia Preprocedure Evaluation (Addendum)
Anesthesia Evaluation  Patient identified by MRN, date of birth, ID band Patient awake    Reviewed: Allergy & Precautions, NPO status , Patient's Chart, lab work & pertinent test results  Airway Mallampati: III  TM Distance: >3 FB Neck ROM: Full    Dental  (+) Teeth Intact, Dental Advisory Given   Pulmonary former smoker,    breath sounds clear to auscultation       Cardiovascular hypertension, Pt. on medications  Rhythm:Regular Rate:Normal     Neuro/Psych negative neurological ROS  negative psych ROS   GI/Hepatic negative GI ROS, Neg liver ROS,   Endo/Other  negative endocrine ROS  Renal/GU negative Renal ROS     Musculoskeletal  (+) Arthritis ,   Abdominal Normal abdominal exam  (+)   Peds  Hematology negative hematology ROS (+)   Anesthesia Other Findings   Reproductive/Obstetrics                            EKG: normal sinus rhythm.  Anesthesia Physical Anesthesia Plan  ASA: II  Anesthesia Plan: Spinal   Post-op Pain Management:  Regional for Post-op pain   Induction: Intravenous  PONV Risk Score and Plan: 0 and Ondansetron and Propofol infusion  Airway Management Planned: Natural Airway and Simple Face Mask  Additional Equipment: None  Intra-op Plan:   Post-operative Plan:   Informed Consent: I have reviewed the patients History and Physical, chart, labs and discussed the procedure including the risks, benefits and alternatives for the proposed anesthesia with the patient or authorized representative who has indicated his/her understanding and acceptance.       Plan Discussed with: CRNA  Anesthesia Plan Comments: (Will attempt spinal, consented for GA w/ LMA as well. )       Anesthesia Quick Evaluation

## 2019-04-18 NOTE — Progress Notes (Signed)
Assisted Dr. Hollis with right, ultrasound guided, adductor canal block. Side rails up, monitors on throughout procedure. See vital signs in flow sheet. Tolerated Procedure well.  

## 2019-04-18 NOTE — Anesthesia Procedure Notes (Signed)
Anesthesia Regional Block: Adductor canal block   Pre-Anesthetic Checklist: ,, timeout performed, Correct Patient, Correct Site, Correct Laterality, Correct Procedure, Correct Position, site marked, Risks and benefits discussed,  Surgical consent,  Pre-op evaluation,  At surgeon's request and post-op pain management  Laterality: Right  Prep: chloraprep       Needles:  Injection technique: Single-shot  Needle Type: Echogenic Stimulator Needle     Needle Length: 9cm  Needle Gauge: 21     Additional Needles:   Procedures:,,,, ultrasound used (permanent image in chart),,,,  Narrative:  Start time: 04/18/2019 9:05 AM End time: 04/18/2019 9:10 AM Injection made incrementally with aspirations every 5 mL.  Performed by: Personally  Anesthesiologist: Effie Berkshire, MD  Additional Notes: Patient tolerated the procedure well. Local anesthetic introduced in an incremental fashion under minimal resistance after negative aspirations. No paresthesias were elicited. After completion of the procedure, no acute issues were identified and patient continued to be monitored by RN.

## 2019-04-18 NOTE — Evaluation (Signed)
Physical Therapy Evaluation Patient Details Name: April Gordon MRN: [id number] DOB: 1940-10-27 Today's Date: 04/18/2019   History of Present Illness  R Tka, H/O Ltka  and R DATHA  Clinical Impression  The patient is progressing very well with ambulation, POD0. Reports no pain, ambulated x 80' Plans Dc home tomorrow. Patient  Should be ready after PT  For gait and exercises. Pt admitted with above diagnosis.  Pt currently with functional limitations due to the deficits listed below (see PT Problem List). Pt will benefit from skilled PT to increase their independence and safety with mobility to allow discharge to the venue listed below.      Follow Up Recommendations Follow surgeon's recommendation for DC plan and follow-up therapies    Equipment Recommendations  None recommended by PT    Recommendations for Other Services       Precautions / Restrictions Precautions Precautions: Knee;Fall Restrictions Weight Bearing Restrictions: No      Mobility  Bed Mobility Overal bed mobility: Needs Assistance Bed Mobility: Supine to Sit     Supine to sit: Supervision     General bed mobility comments: no assistance  Transfers Overall transfer level: Needs assistance Equipment used: Rolling walker (2 wheeled) Transfers: Sit to/from Stand Sit to Stand: Min guard         General transfer comment: cues for hand placement  Ambulation/Gait Ambulation/Gait assistance: Min guard Gait Distance (Feet): 80 Feet Assistive device: Rolling walker (2 wheeled) Gait Pattern/deviations: Step-through pattern;Step-to pattern Gait velocity: decr   General Gait Details: pt prefers to use Optician, dispensing    Modified Rankin (Stroke Patients Only)       Balance Overall balance assessment: Needs assistance   Sitting balance-Leahy Scale: Good     Standing balance support: Bilateral upper extremity supported;During functional  activity Standing balance-Leahy Scale: Good                               Pertinent Vitals/Pain Pain Assessment: No/denies pain    Home Living Family/patient expects to be discharged to:: Private residence   Available Help at Discharge: Family;Available 24 hours/day Type of Home: House Home Access: Stairs to enter   [employer] of Steps: 1 Home Layout: One level;Laundry or work area in [place]: Environmental consultant - standard;Cane - single point Additional Comments: daughter lives with patient.    Prior Function Level of Independence: Independent         Comments: used a cane at times     Hand Dominance        Extremity/Trunk Assessment   Upper Extremity Assessment Upper Extremity Assessment: Overall WFL for tasks assessed    Lower Extremity Assessment Lower Extremity Assessment: RLE deficits/detail RLE Deficits / Details: knee flexion 10-75       Communication   Communication: No difficulties  Cognition Arousal/Alertness: Awake/alert Behavior During Therapy: WFL for tasks assessed/performed Overall Cognitive Status: Within Functional Limits for tasks assessed                                        General Comments      Exercises Total Joint Exercises Ankle Circles/Pumps: AROM;5 reps Quad Sets: AROM;5 reps   Assessment/Plan    PT Assessment Patient needs continued PT services  PT  Problem List Decreased strength;Decreased range of motion;Decreased activity tolerance;Decreased mobility;Decreased knowledge of precautions       PT Treatment Interventions DME instruction;Gait training;Stair training;Functional mobility training;Therapeutic activities;Therapeutic exercise;Patient/family education    PT Goals (Current goals can be found in the Care Plan section)  Acute Rehab PT Goals Patient Stated Goal: go home PT Goal Formulation: With patient Time For Goal Achievement: 04/24/19 Potential to Achieve Goals:  Good    Frequency 7X/week   Barriers to discharge        Co-evaluation               AM-PAC PT "6 Clicks" Mobility  Outcome Measure Help needed turning from your back to your side while in a flat bed without using bedrails?: None Help needed moving from lying on your back to sitting on the side of a flat bed without using bedrails?: None Help needed moving to and from a bed to a chair (including a wheelchair)?: A Little Help needed standing up from a chair using your arms (e.g., wheelchair or bedside chair)?: A Little Help needed to walk in hospital room?: A Little Help needed climbing 3-5 steps with a railing? : A Little 6 Click Score: 20    End of Session Equipment Utilized During Treatment: Gait belt Activity Tolerance: Patient tolerated treatment well Patient left: in chair;with call bell/phone within reach;with chair alarm set Nurse Communication: Mobility status PT Visit Diagnosis: Muscle weakness (generalized) (M62.81);Difficulty in walking, not elsewhere classified (R26.2)    Time: 1533-1600 PT Time Calculation (min) (ACUTE ONLY): 27 min   Charges:   PT Evaluation $PT Eval Low Complexity: 1 Low PT Treatments $Gait Training: 8-22 mins        Tresa Endo PT Acute Rehabilitation Services Pager [phone] Office [phone]   Claretha Cooper 04/18/2019, 4:14 PM

## 2019-04-18 NOTE — Transfer of Care (Signed)
Immediate Anesthesia Transfer of Care Note  Patient: April Gordon  Procedure(s) Performed: TOTAL KNEE ARTHROPLASTY (Right Knee)  Patient Location: PACU  Anesthesia Type:Spinal  Level of Consciousness: awake, alert , oriented and patient cooperative  Airway & Oxygen Therapy: Patient Spontanous Breathing and Patient connected to face mask oxygen  Post-op Assessment: Report given to RN and Post -op Vital signs reviewed and stable  Post vital signs: Reviewed and stable  Last Vitals:  Vitals Value Taken Time  BP 100/67 04/18/19 1246  Temp 36.4 C 04/18/19 1246  Pulse 72 04/18/19 1249  Resp 20 04/18/19 1249  SpO2 100 % 04/18/19 1249  Vitals shown include unvalidated device data.  Last Pain:  Vitals:   04/18/19 0806  TempSrc: Oral      Patients Stated Pain Goal: 4 ([id number])  Complications: No apparent anesthesia complications

## 2019-04-18 NOTE — Op Note (Signed)
NAME:  [facility]                      MEDICAL RECORD NO.:  [id number]                             FACILITY:  [facility]      PHYSICIAN:  Pietro Cassis. Alvan Dame, M.D.  DATE OF BIRTH:  1940/05/01      DATE OF PROCEDURE:  04/18/2019                                     OPERATIVE REPORT         PREOPERATIVE DIAGNOSIS:  Right knee osteoarthritis.      POSTOPERATIVE DIAGNOSIS:  Right knee osteoarthritis.      FINDINGS:  The patient was noted to have complete loss of cartilage and   bone-on-bone arthritis with associated significant osteophytes throughout the knee with most significant issues laterally with associated valgus knee with a significant synovitis and associated effusion.  The patient had failed months of conservative treatment including medications, injection therapy, activity modification.     PROCEDURE:  Right total knee replacement.      COMPONENTS USED:  DePuy Attune rotating platform posterior stabilized knee   system, a size 5 femur, 6 tibia, size 7 mm PS AOX insert, and 38 anatomic patellar   button.      SURGEON:  Pietro Cassis. Alvan Dame, M.D.      ASSISTANT:  Griffith Citron, PA-C.      ANESTHESIA:  Regional and Spinal.      SPECIMENS:  None.      COMPLICATION:  None.      DRAINS:  None.  EBL: <100cc      TOURNIQUET TIME:   Total Tourniquet Time Documented: Thigh (Right) - 57 minutes Total: Thigh (Right) - 57 minutes  .      The patient was stable to the recovery room.      INDICATION FOR PROCEDURE:  April Gordon is a 79 y.o. female patient of   mine.  The patient had been seen, evaluated, and treated for months conservatively in the   office with medication, activity modification, and injections.  The patient had   radiographic changes of bone-on-bone arthritis with endplate sclerosis and osteophytes noted.  Based on the radiographic changes and failed conservative measures, the patient   decided to proceed with definitive treatment, total knee replacement.  Risks of  infection, DVT, component failure, need for revision surgery, neurovascular injury were reviewed in the office setting.  The postop course was reviewed stressing the efforts to maximize post-operative satisfaction and function.  Consent was obtained for benefit of pain   relief.      PROCEDURE IN DETAIL:  The patient was brought to the operative theater.   Once adequate anesthesia, preoperative antibiotics, 2 gm of Anecf,1 gm of Tranexamic Acid, and 10 mg of Decadron administered, the patient was positioned supine with a right thigh tourniquet placed.  The  right lower extremity was prepped and draped in sterile fashion.  A time-   out was performed identifying the patient, planned procedure, and the appropriate extremity.    Her pre-operative ROM was very limited due to her arthritis (0-40 or so).   The right lower extremity was placed in the [facility] leg holder.  The leg was   exsanguinated,  tourniquet elevated to 250 mmHg.  A midline incision was   made followed by median parapatellar arthrotomy.  Following initial   exposure, attention was first directed to the patella.  Precut   measurement was noted to be 24 mm.  I resected down to 14 mm and used a   38 anatomic patellar button to restore patellar height as well as cover the cut surface.      The lug holes were drilled and a metal shim was placed to protect the   patella from retractors and saw blade during the procedure.      At this point, attention was now directed to the femur.  In order to help with exposure I had to remove significant osteophytes around the medial, lateral and anterior femur.  I also had to remove some anterior tibial osteophytes in order to be able to flex the knee enough to work on the femur.  The femoral   canal was opened with a drill, irrigated to try to prevent fat emboli.  An   intramedullary rod was passed at 5 degrees valgus, 9 mm of bone was   resected off the distal femur.  Following this resection, the  tibia was slightly  subluxated anteriorly.  Using the extramedullary guide, 2 mm of bone was resected off   the proximal lateral tibia.  We confirmed the gap would be   stable medially and laterally with a size 5 spacer block as well as confirmed that the tibial cut was perpendicular in the coronal plane, checking with an alignment rod.      Once this was done, I sized the femur to be a size 5 in the anterior-   posterior dimension, chose a standard component based on medial and   lateral dimension.  The size 5 rotation block was then pinned in   position anterior referenced using the C-clamp to set rotation.  The   anterior, posterior, and  chamfer cuts were made without difficulty nor   notching making certain that I was along the anterior cortex to help   with flexion gap stability.  I then used the laminar spreaders to expose the posterior aspect of the knee in order to remove significant posterior osteophytes.     The final box cut was made off the lateral aspect of distal femur.      At this point, I was able to sublux the tibia anteriorly.  The tibia was sized to be a size 6.  The size 6 tray was   then pinned in position through the medial third of the tubercle,   drilled, and keel punched.  Trial reduction was now carried with a 5 femur,  6 tibia, a size 7 mm PS insert, and the 38 anatomic patella botton.  The knee was brought to full extension with good flexion stability with the patella   tracking through the trochlea with some assistance.  Given   all these findings the trial components removed.  Final components were   opened and cement was mixed.  The knee was irrigated with normal saline solution and pulse lavage.  The synovial lining was   then injected with 30 cc of 0.25% Marcaine with epinephrine, 1 cc of Toradol and 30 cc of NS for a total of 61 cc.     Final implants were then cemented onto cleaned and dried cut surfaces of bone with the knee brought to extension with a  size 7 mm PS trial insert.  Once the cement had fully cured, excess cement was removed   throughout the knee.  I confirmed that I was satisfied with the range of   motion and stability, and the final size 7 mm PS AOX insert was chosen.  It was   placed into the knee.      The tourniquet had been let down at 57 minutes.  No significant   hemostasis was required.  The extensor mechanism was then reapproximated using #1 Vicryl and #1 Stratafix sutures with the knee   in flexion.  The   remaining wound was closed with 2-0 Vicryl and running 4-0 Monocryl.   The knee was cleaned, dried, dressed sterilely using Dermabond and   Aquacel dressing.  The patient was then   brought to recovery room in stable condition, tolerating the procedure   well.   Please note that Physician Assistant, Griffith Citron, PA-C was present for the entirety of the case, and was utilized for pre-operative positioning, peri-operative retractor management, general facilitation of the procedure and for primary wound closure at the end of the case.              Pietro Cassis Alvan Dame, M.D.    04/18/2019 1:06 PM

## 2019-04-19 ENCOUNTER — Encounter: Payer: Self-pay | Admitting: *Deleted

## 2019-04-19 DIAGNOSIS — M1711 Unilateral primary osteoarthritis, right knee: Secondary | ICD-10-CM | POA: Diagnosis not present

## 2019-04-19 DIAGNOSIS — Z87891 Personal history of nicotine dependence: Secondary | ICD-10-CM | POA: Diagnosis not present

## 2019-04-19 DIAGNOSIS — Z96653 Presence of artificial knee joint, bilateral: Secondary | ICD-10-CM | POA: Diagnosis not present

## 2019-04-19 DIAGNOSIS — I10 Essential (primary) hypertension: Secondary | ICD-10-CM | POA: Diagnosis not present

## 2019-04-19 DIAGNOSIS — Z96641 Presence of right artificial hip joint: Secondary | ICD-10-CM | POA: Diagnosis not present

## 2019-04-19 LAB — CBC
HCT: 33.9 % — ABNORMAL LOW (ref 36.0–46.0)
Hemoglobin: 10.8 g/dL — ABNORMAL LOW (ref 12.0–15.0)
MCH: 30.3 pg (ref 26.0–34.0)
MCHC: 31.9 g/dL (ref 30.0–36.0)
MCV: 95.2 fL (ref 80.0–100.0)
Platelets: 197 10*3/uL (ref 150–400)
RBC: 3.56 MIL/uL — ABNORMAL LOW (ref 3.87–5.11)
RDW: 12.6 % (ref 11.5–15.5)
WBC: 13.2 10*3/uL — ABNORMAL HIGH (ref 4.0–10.5)
nRBC: 0 % (ref 0.0–0.2)

## 2019-04-19 LAB — BASIC METABOLIC PANEL
Anion gap: 7 (ref 5–15)
BUN: 12 mg/dL (ref 8–23)
CO2: 25 mmol/L (ref 22–32)
Calcium: 8.7 mg/dL — ABNORMAL LOW (ref 8.9–10.3)
Chloride: 107 mmol/L (ref 98–111)
Creatinine, Ser: 0.76 mg/dL (ref 0.44–1.00)
GFR calc Af Amer: >60 mL/min (ref >60)
GFR calc non Af Amer: >60 mL/min (ref >60)
Glucose, Bld: 123 mg/dL — ABNORMAL HIGH (ref 70–99)
Potassium: 4.1 mmol/L (ref 3.5–5.1)
Sodium: 139 mmol/L (ref 135–145)

## 2019-04-19 MED ORDER — ASPIRIN 81 MG PO CHEW: 81.0000 mg | CHEWABLE_TABLET | Freq: Two times a day (BID) | ORAL | 0 refills | Status: AC

## 2019-04-19 MED ORDER — POLYETHYLENE GLYCOL 3350 17 G PO PACK: 17.0000 g | PACK | Freq: Two times a day (BID) | ORAL | 0 refills | Status: DC

## 2019-04-19 MED ORDER — DOCUSATE SODIUM 100 MG PO CAPS: 100.0000 mg | ORAL_CAPSULE | Freq: Two times a day (BID) | ORAL | 0 refills | Status: DC

## 2019-04-19 MED ORDER — OXYCODONE HCL 5 MG PO TABS: 5.0000 mg | ORAL_TABLET | Freq: Four times a day (QID) | ORAL | 0 refills | Status: DC | PRN

## 2019-04-19 MED ORDER — FERROUS SULFATE 325 (65 FE) MG PO TABS: 325.0000 mg | ORAL_TABLET | Freq: Three times a day (TID) | ORAL | 0 refills | Status: DC

## 2019-04-19 MED ORDER — ACETAMINOPHEN 500 MG PO TABS: 1000.0000 mg | ORAL_TABLET | Freq: Three times a day (TID) | ORAL | 0 refills | Status: DC

## 2019-04-19 MED ORDER — METHOCARBAMOL 500 MG PO TABS: 500.0000 mg | ORAL_TABLET | Freq: Four times a day (QID) | ORAL | 0 refills | Status: DC | PRN

## 2019-04-19 NOTE — Progress Notes (Signed)
Patient ID: Andrena Mews, female   DOB: 03-20-1940, 79 y.o.   MRN: [id number] Subjective: 1 Day Post-Op Procedure(s) (LRB): TOTAL KNEE ARTHROPLASTY (Right)    Patient reports pain as mild.  Did well overnight.  No events. Reviewed findings  Objective:   VITALS:   Vitals:   04/19/19 0136 04/19/19 0450  BP: 130/66 131/61  Pulse: (!) 57 65  Resp: 16 16  Temp: 97.9 F (36.6 C) 97.8 F (36.6 C)  SpO2: 100% 100%    Neurovascular intact Incision: dressing C/D/I  LABS Recent Labs    04/19/19 0319  HGB 10.8*  HCT 33.9*  WBC 13.2*  PLT 197    Recent Labs    04/19/19 0319  NA 139  K 4.1  BUN 12  CREATININE 0.76  GLUCOSE 123*    No results for input(s): LABPT, INR in the last 72 hours.   Assessment/Plan: 1 Day Post-Op Procedure(s) (LRB): TOTAL KNEE ARTHROPLASTY (Right)   Advance diet Up with therapy  Home today after therapy Outpt PT already set up RTC in 2 weeks

## 2019-04-19 NOTE — Plan of Care (Signed)
resolved 

## 2019-04-19 NOTE — Progress Notes (Signed)
Physical Therapy Treatment Patient Details Name: Arpita Crisci MRN: [id number] DOB: [birth year] Today's Date: 04/19/2019    History of Present Illness R Tka, H/O Ltka  and R DATHA    PT Comments    Progressing well with mobility. Reviewed/practiced exercises, gait training, and stair training. Issued HEP for pt to perform 2x/day until OP PT begins. All education completed. Okay to d/c from PT standpoint   Follow Up Recommendations  Follow surgeon's recommendation for DC plan and follow-up therapies     Equipment Recommendations  None recommended by PT    Recommendations for Other Services       Precautions / Restrictions Precautions Precautions: Fall;Knee Restrictions Weight Bearing Restrictions: No Other Position/Activity Restrictions: WBAT    Mobility  Bed Mobility               General bed mobility comments: oob in recliner  Transfers Overall transfer level: Needs assistance Equipment used: Rolling walker (2 wheeled) Transfers: Sit to/from Stand Sit to Stand: Supervision         General transfer comment: cues for hand placement  Ambulation/Gait Ambulation/Gait assistance: Supervision Gait Distance (Feet): 100 Feet Assistive device: Rolling walker (2 wheeled) Gait Pattern/deviations: Step-through pattern;Decreased stride length     General Gait Details: for safety. pt tolerated distance well   Stairs Stairs: Yes Stairs assistance: Min guard Stair Management: Forwards;With walker;Step to pattern Number of Stairs: 1(x2) General stair comments: VCs safety, technique, sequence. Close guard for safety.   Wheelchair Mobility    Modified Rankin (Stroke Patients Only)       Balance Overall balance assessment: Mild deficits observed, not formally tested                                          Cognition Arousal/Alertness: Awake/alert Behavior During Therapy: WFL for tasks assessed/performed Overall Cognitive Status: Within  Functional Limits for tasks assessed                                        Exercises Total Joint Exercises Ankle Circles/Pumps: AROM;Both;10 reps Quad Sets: AROM;Both;10 reps Hip ABduction/ADduction: AROM;Right;10 reps Straight Leg Raises: AROM;Right;10 reps Knee Flexion: AROM;Right;10 reps Goniometric ROM: ~5-70 degrees    General Comments        Pertinent Vitals/Pain Pain Assessment: 0-10 Pain Score: 3  Pain Location: R knee Pain Descriptors / Indicators: Discomfort;Sore Pain Intervention(s): Limited activity within patient's tolerance;Monitored during session    Home Living                      Prior Function            PT Goals (current goals can now be found in the care plan section) Progress towards PT goals: Progressing toward goals    Frequency    7X/week      PT Plan Current plan remains appropriate    Co-evaluation              AM-PAC PT "6 Clicks" Mobility   Outcome Measure  Help needed turning from your back to your side while in a flat bed without using bedrails?: None Help needed moving from lying on your back to sitting on the side of a flat bed without using bedrails?: None Help needed moving to and from a bed  to a chair (including a wheelchair)?: A Little Help needed standing up from a chair using your arms (e.g., wheelchair or bedside chair)?: A Little Help needed to walk in hospital room?: A Little Help needed climbing 3-5 steps with a railing? : A Little 6 Click Score: 20    End of Session Equipment Utilized During Treatment: Gait belt Activity Tolerance: Patient tolerated treatment well Patient left: in chair;with call bell/phone within reach   PT Visit Diagnosis: Other abnormalities of gait and mobility (R26.89);Pain Pain - Right/Left: Right Pain - part of body: Knee     Time: [id number] PT Time Calculation (min) (ACUTE ONLY): 18 min  Charges:  $Gait Training: 8-22 mins                          Doreatha Massed, PT Acute Rehabilitation

## 2019-04-19 NOTE — TOC Progression Note (Signed)
Transition of Care [facility]) - Progression Note    Patient Details  Name: April Gordon MRN: [id number] Date of Birth: 05-Sep-1940  Transition of Care [facility]) CM/SW [place], LCSW Phone Number: 04/19/2019, 10:04 AM  Clinical Narrative:    Therapy Plan: OP Has RW and 3 in 1   Expected Discharge Plan: OP Rehab Barriers to Discharge: Barriers Resolved  Expected Discharge Plan and Services Expected Discharge Plan: OP Rehab         Expected Discharge Date: 04/19/19                 DME Agency: NA                   Social Determinants of Health (SDOH) Interventions    Readmission Risk Interventions No flowsheet data found.

## 2019-04-20 NOTE — Discharge Summary (Signed)
Physician Discharge Summary  Patient ID: April Gordon MRN: [id number] DOB/AGE: [birth year] 79 y.o.  Admit date: 04/18/2019 Discharge date: 04/19/2019   Procedures:  Procedure(s) (LRB): TOTAL KNEE ARTHROPLASTY (Right)  Attending Physician:  Dr. Paralee Cancel   Admission Diagnoses:    Right knee primary OA / pain  Discharge Diagnoses:  Principal Problem:   S/P right TKA  Past Medical History:  Diagnosis Date  . Arthritis   . Family history of adverse reaction to anesthesia    Daughter slow to wake up  . HTN (hypertension)     HPI:    April Gordon, 79 y.o. female, has a history of pain and functional disability in the right knee due to arthritis and has failed non-surgical conservative treatments for greater than 12 weeks to includeNSAID's and/or analgesics, corticosteriod injections, use of assistive devices and activity modification.  Onset of symptoms was gradual, starting >10 years ago with gradually worsening course since that time. The patient noted prior procedures on the knee to include  arthroplasty on the left knee(s).  Patient currently rates pain in the right knee(s) at 1 out of 10 with activity, more stiffness than pain.  Patient has worsening of pain with activity and weight bearing, pain that interferes with activities of daily living, pain with passive range of motion and crepitus.  Patient has evidence of periarticular osteophytes and joint space narrowing by imaging studies. There is no active infection.  Risks, benefits and expectations were discussed with the patient.  Risks including but not limited to the risk of anesthesia, blood clots, nerve damage, blood vessel damage, failure of the prosthesis, infection and up to and including death.  Patient understand the risks, benefits and expectations and wishes to proceed with surgery.   PCP: Celene Squibb, MD   Discharged Condition: good  Hospital Course:  Patient underwent the above stated procedure on 04/18/2019. Patient  tolerated the procedure well and brought to the recovery room in good condition and subsequently to the floor.  POD #1 BP: 131/61 ; Pulse: 65 ; Temp: 97.8 F (36.6 C) ; Resp: 16 Patient reports pain as mild.  Did well overnight.  No events.  Reviewed findings. Neurovascular intact and incision: dressing C/D/I.   LABS  Basename    HGB     10.8  HCT     33.9    Discharge Exam: General appearance: alert, cooperative and no distress Extremities: Homans sign is negative, no sign of DVT, no edema, redness or tenderness in the calves or thighs and no ulcers, gangrene or trophic changes  Disposition: Home with follow up in 2 weeks   Follow-up Information    Paralee Cancel, MD. Schedule an appointment as soon as possible for a visit in 2 weeks.   Specialty: Orthopedic Surgery Contact information: [address] [place] [address] [id number]           Discharge Instructions    Call MD / Call 911   Complete by: As directed    If you experience chest pain or shortness of breath, CALL 911 and be transported to the hospital emergency room.  If you develope a fever above 101 F, pus (white drainage) or increased drainage or redness at the wound, or calf pain, call your surgeon's office.   Change dressing   Complete by: As directed    Maintain surgical dressing until follow up in the clinic. If the edges start to pull up, may reinforce with tape. If the dressing is  no longer working, may remove and cover with gauze and tape, but must keep the area dry and clean.  Call with any questions or concerns.   Constipation Prevention   Complete by: As directed    Drink plenty of fluids.  Prune juice may be helpful.  You may use a stool softener, such as Colace (over the counter) 100 mg twice a day.  Use MiraLax (over the counter) for constipation as needed.   Diet - low sodium heart healthy   Complete by: As directed    Discharge instructions   Complete by: As directed     Maintain surgical dressing until follow up in the clinic. If the edges start to pull up, may reinforce with tape. If the dressing is no longer working, may remove and cover with gauze and tape, but must keep the area dry and clean.  Follow up in 2 weeks at [facility]. Call with any questions or concerns.   Increase activity slowly as tolerated   Complete by: As directed    Weight bearing as tolerated with assist device (walker, cane, etc) as directed, use it as long as suggested by your surgeon or therapist, typically at least 4-6 weeks.   TED hose   Complete by: As directed    Use stockings (TED hose) for 2 weeks on both leg(s).  You may remove them at night for sleeping.      Allergies as of 04/19/2019   No Known Allergies     Medication List    TAKE these medications   acetaminophen 500 MG tablet Commonly known as: TYLENOL Take 2 tablets (1,000 mg total) by mouth every 8 (eight) hours.   aspirin 81 MG chewable tablet Commonly known as: Aspirin Childrens Chew 1 tablet (81 mg total) by mouth 2 (two) times daily. Take for 4 weeks, then resume regular dose.   bisacodyl 5 MG EC tablet Generic drug: bisacodyl Take 5 mg by mouth daily as needed for moderate constipation.   docusate sodium 100 MG capsule Commonly known as: Colace Take 1 capsule (100 mg total) by mouth 2 (two) times daily.   ferrous sulfate 325 (65 FE) MG tablet Commonly known as: FerrouSul Take 1 tablet (325 mg total) by mouth 3 (three) times daily with meals for 14 days.   hydrochlorothiazide 50 MG tablet Commonly known as: HYDRODIURIL Take 50 mg by mouth daily with lunch.   methocarbamol 500 MG tablet Commonly known as: Robaxin Take 1 tablet (500 mg total) by mouth every 6 (six) hours as needed for muscle spasms.   oxyCODONE 5 MG immediate release tablet Commonly known as: Oxy IR/ROXICODONE Take 1-2 tablets (5-10 mg total) by mouth every 6 (six) hours as needed for moderate pain or severe pain. What  changed: when to take this   polyethylene glycol 17 g packet Commonly known as: MIRALAX / GLYCOLAX Take 17 g by mouth 2 (two) times daily.   verapamil 240 MG CR tablet Commonly known as: CALAN-SR Take 240 mg by mouth daily with lunch.   vitamin B-12 250 MCG tablet Commonly known as: CYANOCOBALAMIN Take 250 mcg by mouth daily.   VITAMIN D PO Take 1,000 Units by mouth daily.   vitamin E 180 MG (400 UNITS) capsule Take 400 Units by mouth daily.            Discharge Care Instructions  (From admission, onward)         Start     Ordered   04/19/19 0000  Change  dressing    Comments: Maintain surgical dressing until follow up in the clinic. If the edges start to pull up, may reinforce with tape. If the dressing is no longer working, may remove and cover with gauze and tape, but must keep the area dry and clean.  Call with any questions or concerns.   04/19/19 0746           Signed: [place] Pugh. Eura Mccauslin   PA-C  04/20/2019, 8:32 AM

## 2019-04-29 ENCOUNTER — Ambulatory Visit: Payer: Medicare HMO | Attending: Internal Medicine

## 2019-04-29 DIAGNOSIS — Z23 Encounter for immunization: Secondary | ICD-10-CM

## 2019-04-29 NOTE — Progress Notes (Signed)
   Covid-19 Vaccination Clinic  Name:  April Gordon    MRN: [id number] DOB: 03/19/1940  04/29/2019  [name] was observed post Covid-19 immunization for 15 minutes without incident. She was provided with Vaccine Information Sheet and instruction to access the V-Safe system.   [name] was instructed to call 911 with any severe reactions post vaccine: Marland Kitchen Difficulty breathing  . Swelling of face and throat  . A fast heartbeat  . A bad rash all over body  . Dizziness and weakness   Immunizations Administered    Name Date Dose VIS Date Route   Moderna COVID-19 Vaccine 04/29/2019 10:41 AM 0.5 mL 12/20/2018 Intramuscular   Manufacturer: Moderna   Lot: [id number]   [id number]

## 2019-05-15 DIAGNOSIS — M25661 Stiffness of right knee, not elsewhere classified: Secondary | ICD-10-CM | POA: Diagnosis not present

## 2019-05-15 DIAGNOSIS — M6281 Muscle weakness (generalized): Secondary | ICD-10-CM | POA: Diagnosis not present

## 2019-05-15 DIAGNOSIS — R262 Difficulty in walking, not elsewhere classified: Secondary | ICD-10-CM | POA: Diagnosis not present

## 2019-05-15 DIAGNOSIS — M25561 Pain in right knee: Secondary | ICD-10-CM | POA: Diagnosis not present

## 2019-05-15 DIAGNOSIS — M25651 Stiffness of right hip, not elsewhere classified: Secondary | ICD-10-CM | POA: Diagnosis not present

## 2019-05-22 DIAGNOSIS — M25661 Stiffness of right knee, not elsewhere classified: Secondary | ICD-10-CM | POA: Diagnosis not present

## 2019-05-22 DIAGNOSIS — M25651 Stiffness of right hip, not elsewhere classified: Secondary | ICD-10-CM | POA: Diagnosis not present

## 2019-05-22 DIAGNOSIS — R262 Difficulty in walking, not elsewhere classified: Secondary | ICD-10-CM | POA: Diagnosis not present

## 2019-05-22 DIAGNOSIS — M25561 Pain in right knee: Secondary | ICD-10-CM | POA: Diagnosis not present

## 2019-05-22 DIAGNOSIS — M6281 Muscle weakness (generalized): Secondary | ICD-10-CM | POA: Diagnosis not present

## 2019-05-25 DIAGNOSIS — M25561 Pain in right knee: Secondary | ICD-10-CM | POA: Diagnosis not present

## 2019-05-25 DIAGNOSIS — M25661 Stiffness of right knee, not elsewhere classified: Secondary | ICD-10-CM | POA: Diagnosis not present

## 2019-05-25 DIAGNOSIS — M6281 Muscle weakness (generalized): Secondary | ICD-10-CM | POA: Diagnosis not present

## 2019-05-25 DIAGNOSIS — M25651 Stiffness of right hip, not elsewhere classified: Secondary | ICD-10-CM | POA: Diagnosis not present

## 2019-05-25 DIAGNOSIS — R262 Difficulty in walking, not elsewhere classified: Secondary | ICD-10-CM | POA: Diagnosis not present

## 2019-05-31 DIAGNOSIS — M25561 Pain in right knee: Secondary | ICD-10-CM | POA: Diagnosis not present

## 2019-05-31 DIAGNOSIS — M25661 Stiffness of right knee, not elsewhere classified: Secondary | ICD-10-CM | POA: Diagnosis not present

## 2019-05-31 DIAGNOSIS — R262 Difficulty in walking, not elsewhere classified: Secondary | ICD-10-CM | POA: Diagnosis not present

## 2019-05-31 DIAGNOSIS — M6281 Muscle weakness (generalized): Secondary | ICD-10-CM | POA: Diagnosis not present

## 2019-05-31 DIAGNOSIS — M25651 Stiffness of right hip, not elsewhere classified: Secondary | ICD-10-CM | POA: Diagnosis not present

## 2019-06-02 DIAGNOSIS — M25661 Stiffness of right knee, not elsewhere classified: Secondary | ICD-10-CM | POA: Diagnosis not present

## 2019-06-02 DIAGNOSIS — R262 Difficulty in walking, not elsewhere classified: Secondary | ICD-10-CM | POA: Diagnosis not present

## 2019-06-02 DIAGNOSIS — M25651 Stiffness of right hip, not elsewhere classified: Secondary | ICD-10-CM | POA: Diagnosis not present

## 2019-06-02 DIAGNOSIS — M25561 Pain in right knee: Secondary | ICD-10-CM | POA: Diagnosis not present

## 2019-06-02 DIAGNOSIS — M6281 Muscle weakness (generalized): Secondary | ICD-10-CM | POA: Diagnosis not present

## 2019-06-06 DIAGNOSIS — M25661 Stiffness of right knee, not elsewhere classified: Secondary | ICD-10-CM | POA: Diagnosis not present

## 2019-06-06 DIAGNOSIS — R262 Difficulty in walking, not elsewhere classified: Secondary | ICD-10-CM | POA: Diagnosis not present

## 2019-06-06 DIAGNOSIS — M25651 Stiffness of right hip, not elsewhere classified: Secondary | ICD-10-CM | POA: Diagnosis not present

## 2019-06-06 DIAGNOSIS — M25561 Pain in right knee: Secondary | ICD-10-CM | POA: Diagnosis not present

## 2019-06-06 DIAGNOSIS — M6281 Muscle weakness (generalized): Secondary | ICD-10-CM | POA: Diagnosis not present

## 2019-06-13 DIAGNOSIS — Z712 Person consulting for explanation of examination or test findings: Secondary | ICD-10-CM | POA: Diagnosis not present

## 2019-06-13 DIAGNOSIS — R748 Abnormal levels of other serum enzymes: Secondary | ICD-10-CM | POA: Diagnosis not present

## 2019-06-13 DIAGNOSIS — M8580 Other specified disorders of bone density and structure, unspecified site: Secondary | ICD-10-CM | POA: Diagnosis not present

## 2019-06-13 DIAGNOSIS — Z0000 Encounter for general adult medical examination without abnormal findings: Secondary | ICD-10-CM | POA: Diagnosis not present

## 2019-06-13 DIAGNOSIS — Z6833 Body mass index (BMI) 33.0-33.9, adult: Secondary | ICD-10-CM | POA: Diagnosis not present

## 2019-06-13 DIAGNOSIS — Z0001 Encounter for general adult medical examination with abnormal findings: Secondary | ICD-10-CM | POA: Diagnosis not present

## 2019-06-13 DIAGNOSIS — Z6831 Body mass index (BMI) 31.0-31.9, adult: Secondary | ICD-10-CM | POA: Diagnosis not present

## 2019-06-13 DIAGNOSIS — L209 Atopic dermatitis, unspecified: Secondary | ICD-10-CM | POA: Diagnosis not present

## 2019-06-13 DIAGNOSIS — E669 Obesity, unspecified: Secondary | ICD-10-CM | POA: Diagnosis not present

## 2019-06-16 DIAGNOSIS — Z471 Aftercare following joint replacement surgery: Secondary | ICD-10-CM | POA: Diagnosis not present

## 2019-06-16 DIAGNOSIS — M25561 Pain in right knee: Secondary | ICD-10-CM | POA: Diagnosis not present

## 2019-06-16 DIAGNOSIS — Z96652 Presence of left artificial knee joint: Secondary | ICD-10-CM | POA: Diagnosis not present

## 2019-06-21 DIAGNOSIS — R748 Abnormal levels of other serum enzymes: Secondary | ICD-10-CM | POA: Diagnosis not present

## 2019-06-21 DIAGNOSIS — Z6831 Body mass index (BMI) 31.0-31.9, adult: Secondary | ICD-10-CM | POA: Diagnosis not present

## 2019-06-21 DIAGNOSIS — M8580 Other specified disorders of bone density and structure, unspecified site: Secondary | ICD-10-CM | POA: Diagnosis not present

## 2019-06-21 DIAGNOSIS — I10 Essential (primary) hypertension: Secondary | ICD-10-CM | POA: Diagnosis not present

## 2019-06-21 DIAGNOSIS — Z0000 Encounter for general adult medical examination without abnormal findings: Secondary | ICD-10-CM | POA: Diagnosis not present

## 2019-06-21 DIAGNOSIS — L209 Atopic dermatitis, unspecified: Secondary | ICD-10-CM | POA: Diagnosis not present

## 2019-06-21 DIAGNOSIS — E669 Obesity, unspecified: Secondary | ICD-10-CM | POA: Diagnosis not present

## 2019-07-25 DIAGNOSIS — R69 Illness, unspecified: Secondary | ICD-10-CM | POA: Diagnosis not present

## 2019-08-29 DIAGNOSIS — Z1231 Encounter for screening mammogram for malignant neoplasm of breast: Secondary | ICD-10-CM | POA: Diagnosis not present

## 2019-10-30 IMAGING — US US EXTREM LOW VENOUS*L*
1 series · 13 of 24 positions shown · non-contrast
Comparison: None.

CLINICAL DATA: Left lower extremity pain and swelling. Recent
replacement 04/21/2016



[Series 1: us extrem low venous*left* · 0.07mm/px · 13 of 34 slices shown]
[im 1/34]
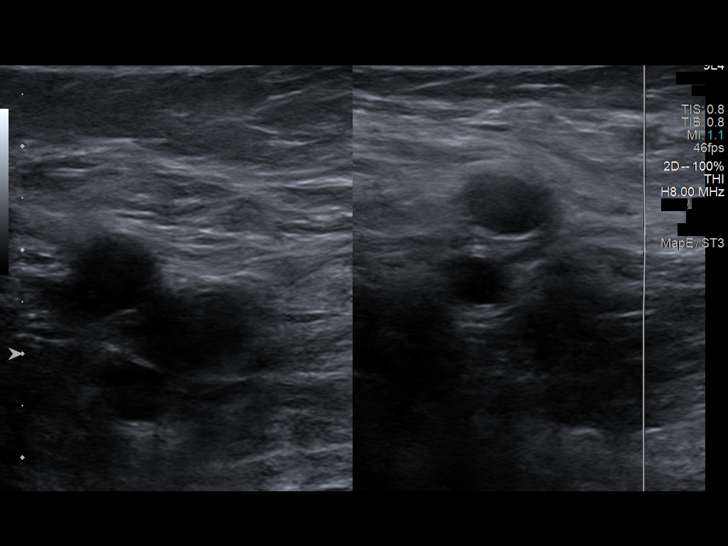
[im 3/34]
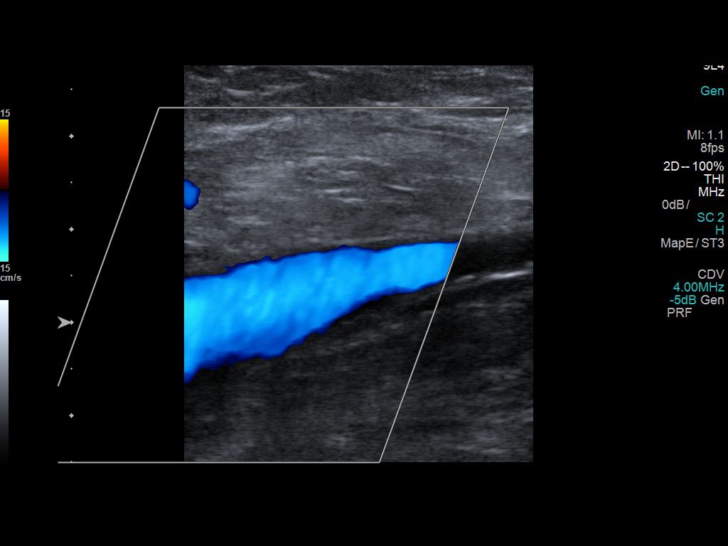
[im 6/34]
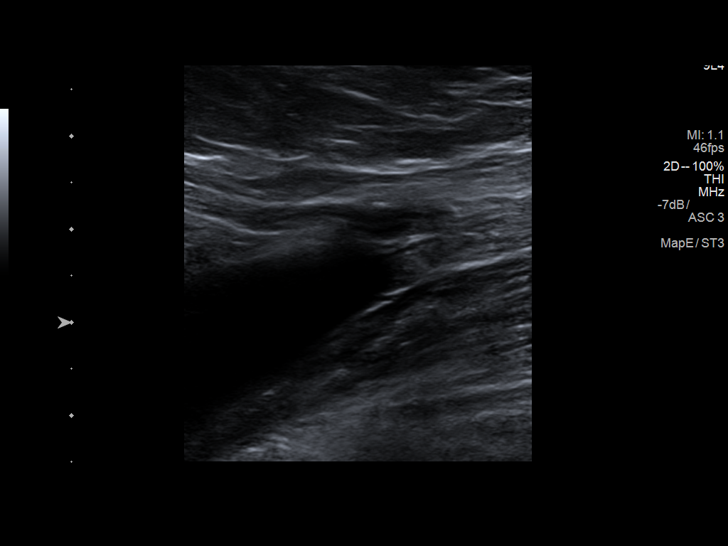
[im 9/34]
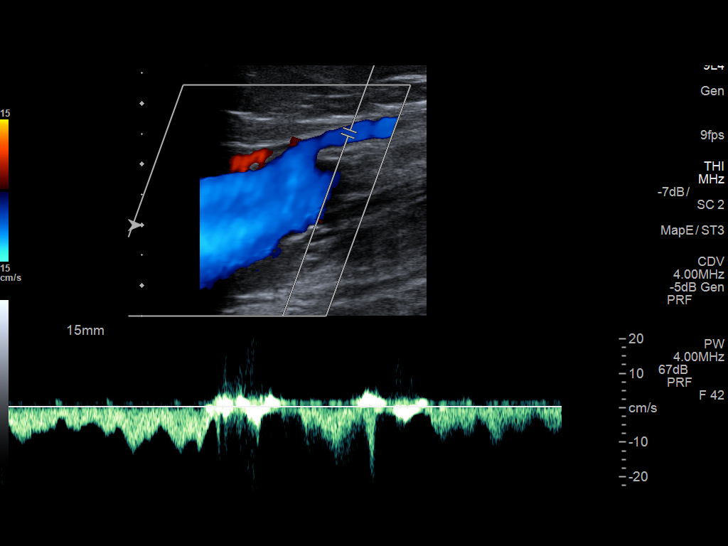
[im 12/34]
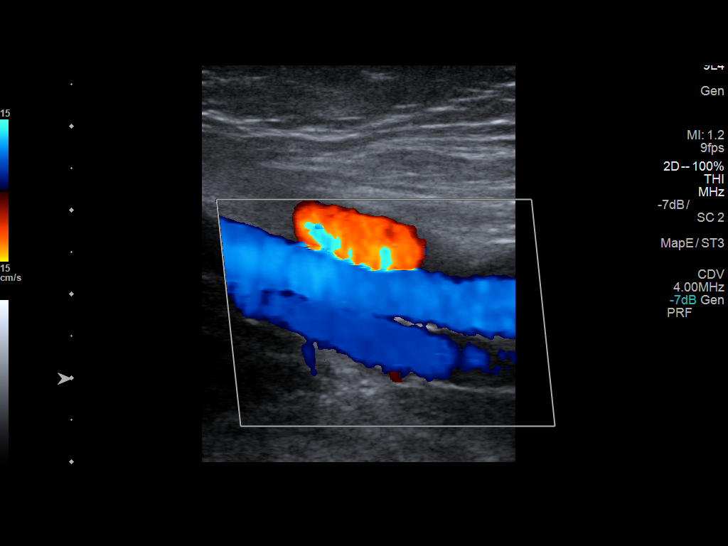
[im 15/34]
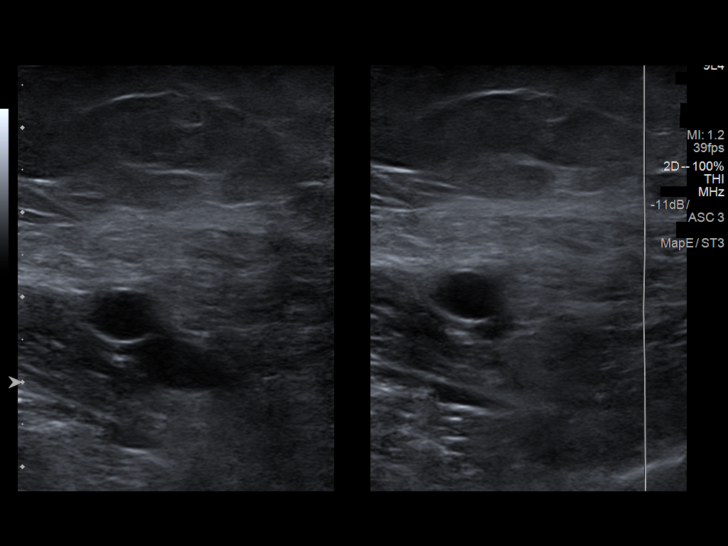
[im 18/34]
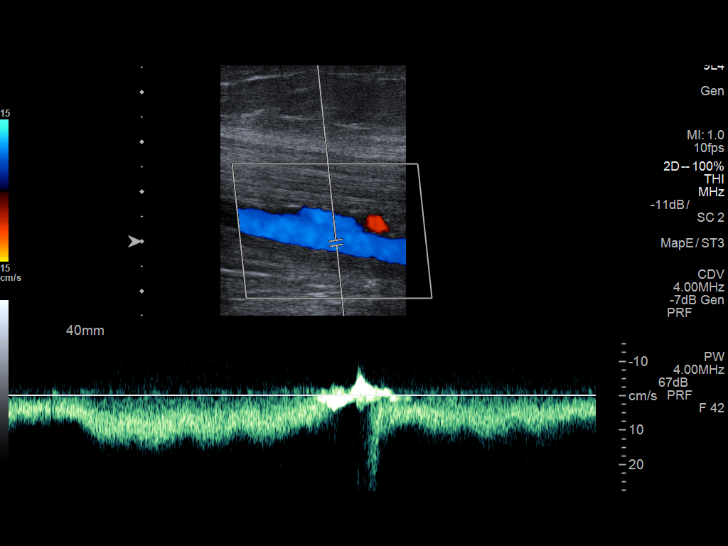
[im 19/34]
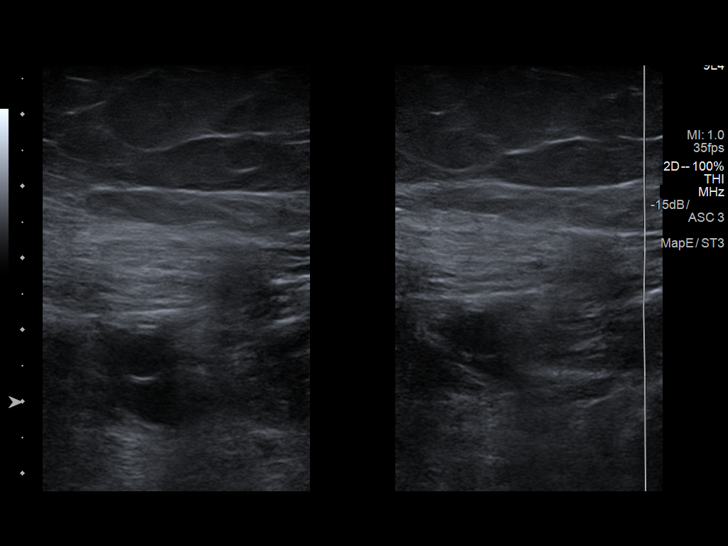
[im 22/34]
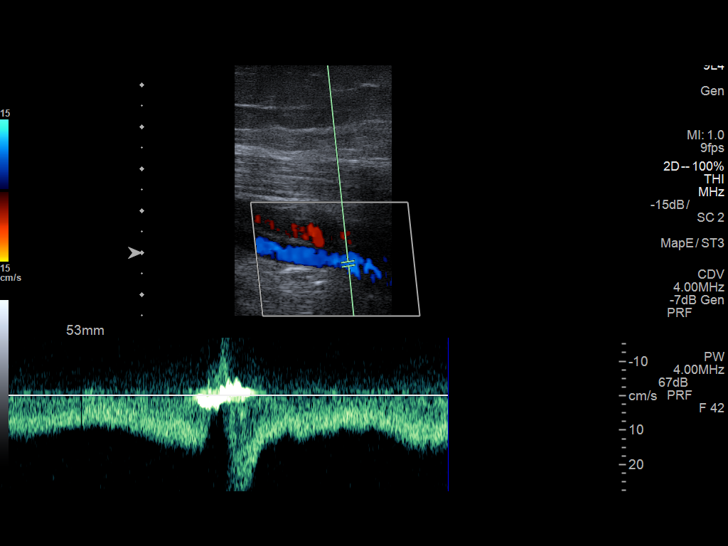
[im 25/34]
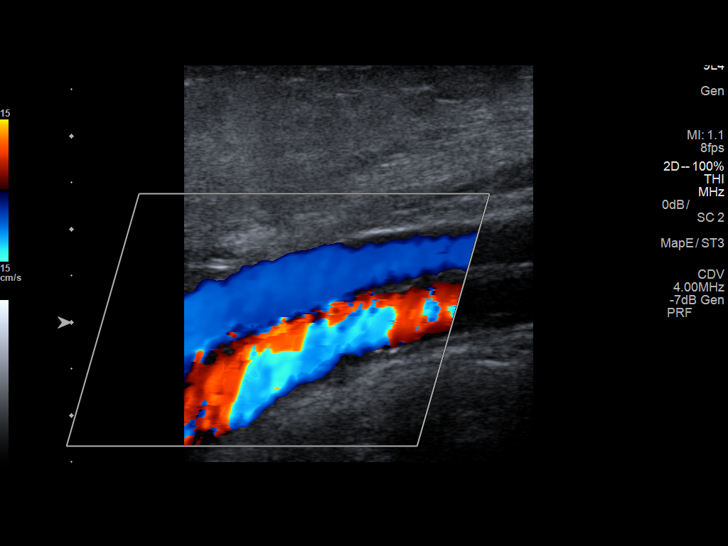
[im 28/34]
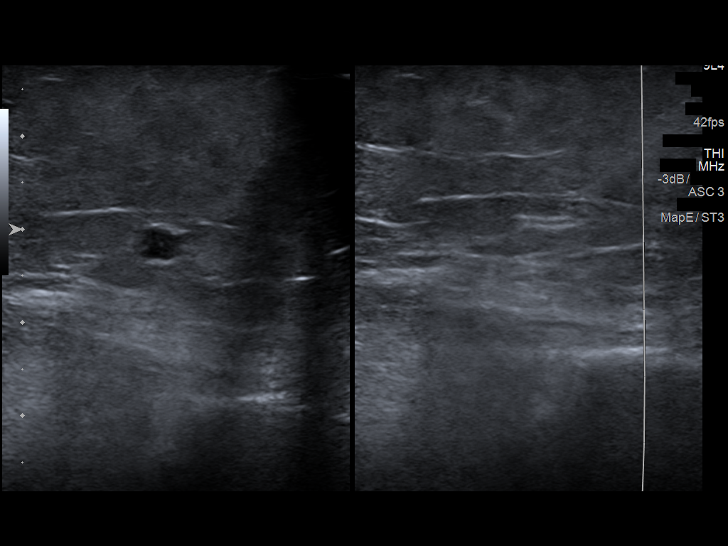
[im 31/34]
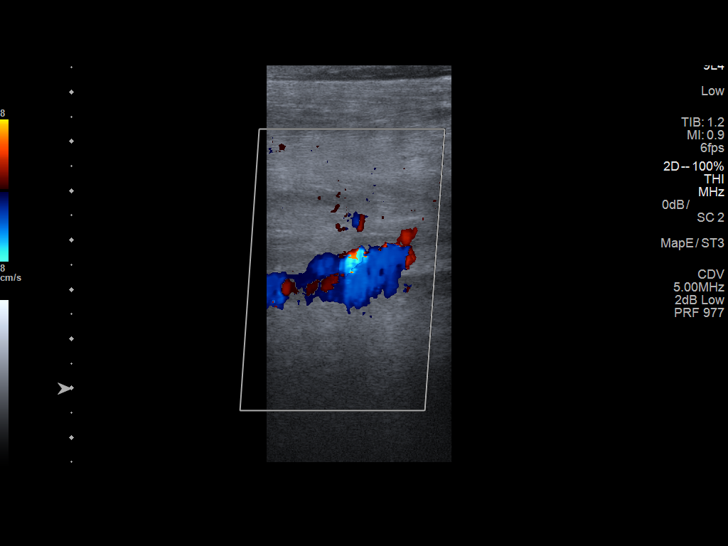
[im 34/34]
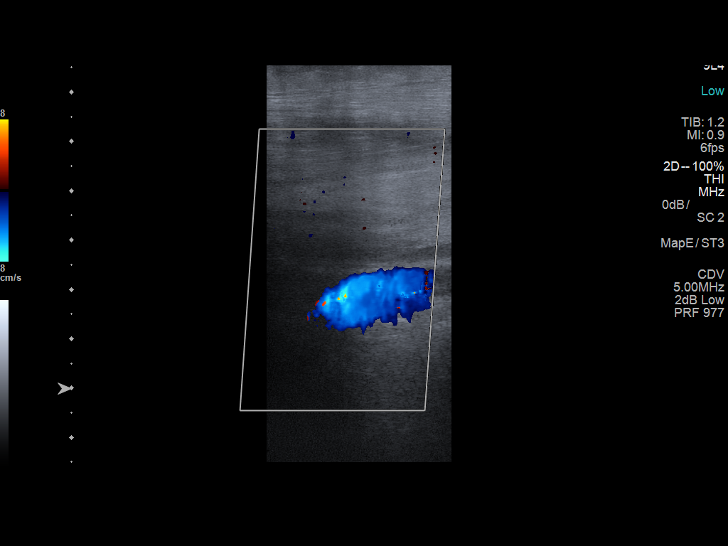

[13 of 24 positions shown; findings below may reference images not displayed]

FINDINGS: Contralateral Common Femoral Vein: Respiratory phasicity is normal
and symmetric with the symptomatic side. No evidence of thrombus.
Normal compressibility.

Common Femoral Vein: No evidence of thrombus. Normal
compressibility, respiratory phasicity and response to augmentation.

Saphenofemoral Junction: No evidence of thrombus. Normal
compressibility and flow on color Doppler imaging.

Profunda Femoral Vein: No evidence of thrombus. Normal
compressibility and flow on color Doppler imaging.

Femoral Vein: No evidence of thrombus. Normal compressibility,
respiratory phasicity and response to augmentation.

Popliteal Vein: No evidence of thrombus. Normal compressibility,
respiratory phasicity and response to augmentation.

Calf Veins: Limited assessment of the calf veins. No gross occlusive
thrombus.

Superficial Great Saphenous Vein: No evidence of thrombus. Normal
compressibility.

Venous Reflux:  None.

Other Findings:  Peripheral subcutaneous edema.
IMPRESSION: No significant left lower extremity femoropopliteal DVT. Limited
assessment of the calf veins. Peripheral subcutaneous edema.

## 2019-11-08 DIAGNOSIS — Z23 Encounter for immunization: Secondary | ICD-10-CM | POA: Diagnosis not present

## 2019-12-18 DIAGNOSIS — Z6831 Body mass index (BMI) 31.0-31.9, adult: Secondary | ICD-10-CM | POA: Diagnosis not present

## 2019-12-18 DIAGNOSIS — M8580 Other specified disorders of bone density and structure, unspecified site: Secondary | ICD-10-CM | POA: Diagnosis not present

## 2019-12-18 DIAGNOSIS — Z0001 Encounter for general adult medical examination with abnormal findings: Secondary | ICD-10-CM | POA: Diagnosis not present

## 2019-12-18 DIAGNOSIS — Z0000 Encounter for general adult medical examination without abnormal findings: Secondary | ICD-10-CM | POA: Diagnosis not present

## 2019-12-18 DIAGNOSIS — L209 Atopic dermatitis, unspecified: Secondary | ICD-10-CM | POA: Diagnosis not present

## 2019-12-18 DIAGNOSIS — Z23 Encounter for immunization: Secondary | ICD-10-CM | POA: Diagnosis not present

## 2019-12-18 DIAGNOSIS — E669 Obesity, unspecified: Secondary | ICD-10-CM | POA: Diagnosis not present

## 2019-12-18 DIAGNOSIS — Z712 Person consulting for explanation of examination or test findings: Secondary | ICD-10-CM | POA: Diagnosis not present

## 2019-12-18 DIAGNOSIS — Z6833 Body mass index (BMI) 33.0-33.9, adult: Secondary | ICD-10-CM | POA: Diagnosis not present

## 2019-12-22 DIAGNOSIS — E782 Mixed hyperlipidemia: Secondary | ICD-10-CM | POA: Diagnosis not present

## 2019-12-22 DIAGNOSIS — Z6834 Body mass index (BMI) 34.0-34.9, adult: Secondary | ICD-10-CM | POA: Diagnosis not present

## 2019-12-22 DIAGNOSIS — M8580 Other specified disorders of bone density and structure, unspecified site: Secondary | ICD-10-CM | POA: Diagnosis not present

## 2019-12-22 DIAGNOSIS — R748 Abnormal levels of other serum enzymes: Secondary | ICD-10-CM | POA: Diagnosis not present

## 2019-12-22 DIAGNOSIS — I10 Essential (primary) hypertension: Secondary | ICD-10-CM | POA: Diagnosis not present

## 2019-12-22 DIAGNOSIS — L209 Atopic dermatitis, unspecified: Secondary | ICD-10-CM | POA: Diagnosis not present

## 2019-12-22 DIAGNOSIS — E669 Obesity, unspecified: Secondary | ICD-10-CM | POA: Diagnosis not present

## 2020-04-29 DIAGNOSIS — Z96651 Presence of right artificial knee joint: Secondary | ICD-10-CM | POA: Diagnosis not present

## 2020-04-29 DIAGNOSIS — Z96641 Presence of right artificial hip joint: Secondary | ICD-10-CM | POA: Diagnosis not present

## 2020-04-29 DIAGNOSIS — Z96653 Presence of artificial knee joint, bilateral: Secondary | ICD-10-CM | POA: Diagnosis not present

## 2020-06-27 DIAGNOSIS — I10 Essential (primary) hypertension: Secondary | ICD-10-CM | POA: Diagnosis not present

## 2020-07-02 DIAGNOSIS — R749 Abnormal serum enzyme level, unspecified: Secondary | ICD-10-CM | POA: Diagnosis not present

## 2020-07-02 DIAGNOSIS — Z1211 Encounter for screening for malignant neoplasm of colon: Secondary | ICD-10-CM | POA: Diagnosis not present

## 2020-07-02 DIAGNOSIS — E782 Mixed hyperlipidemia: Secondary | ICD-10-CM | POA: Diagnosis not present

## 2020-07-02 DIAGNOSIS — E669 Obesity, unspecified: Secondary | ICD-10-CM | POA: Diagnosis not present

## 2020-07-02 DIAGNOSIS — I10 Essential (primary) hypertension: Secondary | ICD-10-CM | POA: Diagnosis not present

## 2020-07-02 DIAGNOSIS — M8580 Other specified disorders of bone density and structure, unspecified site: Secondary | ICD-10-CM | POA: Diagnosis not present

## 2020-07-03 DIAGNOSIS — Z0100 Encounter for examination of eyes and vision without abnormal findings: Secondary | ICD-10-CM | POA: Diagnosis not present

## 2020-07-03 DIAGNOSIS — H5200 Hypermetropia, unspecified eye: Secondary | ICD-10-CM | POA: Diagnosis not present

## 2020-10-08 DIAGNOSIS — Z1231 Encounter for screening mammogram for malignant neoplasm of breast: Secondary | ICD-10-CM | POA: Diagnosis not present

## 2020-10-30 DIAGNOSIS — Z23 Encounter for immunization: Secondary | ICD-10-CM | POA: Diagnosis not present

## 2020-12-12 DIAGNOSIS — Z1211 Encounter for screening for malignant neoplasm of colon: Secondary | ICD-10-CM | POA: Diagnosis not present

## 2020-12-18 LAB — COLOGUARD: COLOGUARD: POSITIVE — AB

## 2020-12-23 ENCOUNTER — Encounter: Payer: Self-pay | Admitting: Internal Medicine

## 2021-01-01 DIAGNOSIS — I10 Essential (primary) hypertension: Secondary | ICD-10-CM | POA: Diagnosis not present

## 2021-01-16 DIAGNOSIS — R749 Abnormal serum enzyme level, unspecified: Secondary | ICD-10-CM | POA: Diagnosis not present

## 2021-01-16 DIAGNOSIS — Z1211 Encounter for screening for malignant neoplasm of colon: Secondary | ICD-10-CM | POA: Diagnosis not present

## 2021-01-16 DIAGNOSIS — E669 Obesity, unspecified: Secondary | ICD-10-CM | POA: Diagnosis not present

## 2021-01-16 DIAGNOSIS — I10 Essential (primary) hypertension: Secondary | ICD-10-CM | POA: Diagnosis not present

## 2021-01-16 DIAGNOSIS — M8580 Other specified disorders of bone density and structure, unspecified site: Secondary | ICD-10-CM | POA: Diagnosis not present

## 2021-01-16 DIAGNOSIS — L853 Xerosis cutis: Secondary | ICD-10-CM | POA: Diagnosis not present

## 2021-01-16 DIAGNOSIS — E782 Mixed hyperlipidemia: Secondary | ICD-10-CM | POA: Diagnosis not present

## 2021-02-11 ENCOUNTER — Other Ambulatory Visit: Payer: Self-pay

## 2021-02-11 ENCOUNTER — Ambulatory Visit (INDEPENDENT_AMBULATORY_CARE_PROVIDER_SITE_OTHER): Payer: Self-pay | Admitting: *Deleted

## 2021-02-11 VITALS — Ht 62.0 in | Wt 199.0 lb

## 2021-02-11 DIAGNOSIS — Z1211 Encounter for screening for malignant neoplasm of colon: Secondary | ICD-10-CM

## 2021-02-11 NOTE — Progress Notes (Addendum)
Gastroenterology Pre-Procedure Review  Request Date: 02/11/2021 Requesting Physician: Valentino Nose, NP, no previous TCS, positive cologuard 12/12/2020  PATIENT REVIEW QUESTIONS: The patient responded to the following health history questions as indicated:    1. Diabetes Melitis: no 2. Joint replacements in the past 12 months: no 3. Major health problems in the past 3 months: no 4. Has an artificial valve or MVP: no 5. Has a defibrillator: no 6. Has been advised in past to take antibiotics in advance of a procedure like teeth cleaning: yes, former knee and hip replacement 7. Family history of colon cancer: no  8. Alcohol Use: Yes, wine occasionally every few months 9. Illicit drug Use: no 10. History of sleep apnea: no  11. History of coronary artery or other vascular stents placed within the last 12 months: no 12. History of any prior anesthesia complications: no 13. Body mass index is 36.4 kg/m.    MEDICATIONS & ALLERGIES:    Patient reports the following regarding taking any blood thinners:   Plavix? no Aspirin? Yes, 81 mg daily Coumadin? no Brilinta? no Xarelto? no Eliquis? no Pradaxa? no Savaysa? no Effient? no  Patient confirms/reports the following medications:  Current Outpatient Medications  Medication Sig Dispense Refill   aspirin EC 81 MG tablet Take 81 mg by mouth daily. Swallow whole.     Cholecalciferol (VITAMIN D PO) Take 1,000 Units by mouth daily. Takes 1/2 tablet daily.     hydrochlorothiazide (HYDRODIURIL) 50 MG tablet Take 50 mg by mouth daily with lunch.      verapamil (CALAN-SR) 240 MG CR tablet Take 240 mg by mouth daily with lunch.      vitamin E 400 UNIT capsule Take 400 Units by mouth daily.     No current facility-administered medications for this visit.    Patient confirms/reports the following allergies:  No Known Allergies  No orders of the defined types were placed in this encounter.   [place] INFORMATION Primary Insurance:  [place],  ID #: [id number],  Group #: [id number] Pre-Cert / Auth required: Yes, approved [id number] Pre-Cert / Josem Kaufmann #: [id number]  SCHEDULE INFORMATION: Procedure has been scheduled as follows:  Date:02/17/2021 , Time: 9:00 Location: [facility] with Dr. Abbey Chatters  This Gastroenterology Pre-Precedure Review Form is being routed to the following provider(s): Neil Crouch, PA-C

## 2021-02-13 ENCOUNTER — Encounter: Payer: Self-pay | Admitting: *Deleted

## 2021-02-13 ENCOUNTER — Other Ambulatory Visit: Payer: Self-pay | Admitting: *Deleted

## 2021-02-13 DIAGNOSIS — Z1211 Encounter for screening for malignant neoplasm of colon: Secondary | ICD-10-CM

## 2021-02-13 MED ORDER — PEG 3350-KCL-NA BICARB-NACL 420 G PO SOLR: 4000.0000 mL | Freq: Once | ORAL | 0 refills | Status: AC

## 2021-02-13 NOTE — Progress Notes (Signed)
Spoke to pt.  Scheduled procedure for 02/17/2021 with arrival at 7:30.  Reviewed prep instructions with pt.  Sent RX to pharmacy.  Pt aware to have labs drawn tomorrow 02/14/2021 at [facility].  Pt voiced understanding.  Pt came by office and picked up prep instructions.

## 2021-02-13 NOTE — Progress Notes (Signed)
Ok to schedule. ASA 2.  

## 2021-02-13 NOTE — Addendum Note (Signed)
Addended by: Metro Kung on: 02/13/2021 04:00 PM   Modules accepted: Orders

## 2021-02-14 ENCOUNTER — Other Ambulatory Visit: Payer: Self-pay | Admitting: Gastroenterology

## 2021-02-14 ENCOUNTER — Other Ambulatory Visit (HOSPITAL_COMMUNITY)
Admission: RE | Admit: 2021-02-14 | Discharge: 2021-02-14 | Disposition: A | Discharge status: Home / Self Care | Payer: Medicare HMO | Source: Ambulatory Visit | Attending: Internal Medicine | Admitting: Internal Medicine

## 2021-02-14 ENCOUNTER — Other Ambulatory Visit: Payer: Self-pay | Admitting: *Deleted

## 2021-02-14 DIAGNOSIS — Z1211 Encounter for screening for malignant neoplasm of colon: Secondary | ICD-10-CM | POA: Insufficient documentation

## 2021-02-14 DIAGNOSIS — E876 Hypokalemia: Secondary | ICD-10-CM

## 2021-02-14 LAB — BASIC METABOLIC PANEL
Anion gap: 6 (ref 5–15)
BUN: 22 mg/dL (ref 8–23)
CO2: 29 mmol/L (ref 22–32)
Calcium: 8.9 mg/dL (ref 8.9–10.3)
Chloride: 101 mmol/L (ref 98–111)
Creatinine, Ser: 0.8 mg/dL (ref 0.44–1.00)
GFR, Estimated: >60 mL/min (ref >60)
Glucose, Bld: 93 mg/dL (ref 70–99)
Potassium: 3.1 mmol/L — ABNORMAL LOW (ref 3.5–5.1)
Sodium: 136 mmol/L (ref 135–145)

## 2021-02-14 MED ORDER — POTASSIUM CHLORIDE CRYS ER 20 MEQ PO TBCR: 40.0000 meq | EXTENDED_RELEASE_TABLET | Freq: Every day | ORAL | 0 refills | Status: AC

## 2021-02-14 NOTE — Progress Notes (Signed)
Sending in potassium chloride 40 meq x 3 days per Dr. Roseanne Kaufman request.

## 2021-02-17 ENCOUNTER — Ambulatory Visit (HOSPITAL_COMMUNITY)
Admission: RE | Admit: 2021-02-17 | Discharge: 2021-02-17 | Disposition: A | Discharge status: Home / Self Care | Payer: Medicare HMO | Attending: Internal Medicine | Admitting: Internal Medicine

## 2021-02-17 ENCOUNTER — Ambulatory Visit (HOSPITAL_COMMUNITY): Payer: Medicare HMO | Admitting: Anesthesiology

## 2021-02-17 ENCOUNTER — Encounter (HOSPITAL_COMMUNITY): Payer: Self-pay | Admitting: Internal Medicine

## 2021-02-17 ENCOUNTER — Encounter (HOSPITAL_COMMUNITY)
Admission: RE | Disposition: A | Discharge status: Home / Self Care | Payer: Self-pay | Source: Home / Self Care | Attending: Internal Medicine

## 2021-02-17 ENCOUNTER — Other Ambulatory Visit: Payer: Self-pay

## 2021-02-17 DIAGNOSIS — E876 Hypokalemia: Secondary | ICD-10-CM | POA: Diagnosis not present

## 2021-02-17 DIAGNOSIS — K5730 Diverticulosis of large intestine without perforation or abscess without bleeding: Secondary | ICD-10-CM | POA: Diagnosis not present

## 2021-02-17 DIAGNOSIS — Z139 Encounter for screening, unspecified: Secondary | ICD-10-CM | POA: Diagnosis not present

## 2021-02-17 DIAGNOSIS — K635 Polyp of colon: Secondary | ICD-10-CM | POA: Diagnosis not present

## 2021-02-17 DIAGNOSIS — Z1212 Encounter for screening for malignant neoplasm of rectum: Secondary | ICD-10-CM

## 2021-02-17 DIAGNOSIS — Z1211 Encounter for screening for malignant neoplasm of colon: Secondary | ICD-10-CM

## 2021-02-17 DIAGNOSIS — D122 Benign neoplasm of ascending colon: Secondary | ICD-10-CM | POA: Diagnosis not present

## 2021-02-17 DIAGNOSIS — I10 Essential (primary) hypertension: Secondary | ICD-10-CM | POA: Insufficient documentation

## 2021-02-17 DIAGNOSIS — R195 Other fecal abnormalities: Secondary | ICD-10-CM | POA: Insufficient documentation

## 2021-02-17 DIAGNOSIS — K5909 Other constipation: Secondary | ICD-10-CM | POA: Insufficient documentation

## 2021-02-17 DIAGNOSIS — Z87891 Personal history of nicotine dependence: Secondary | ICD-10-CM | POA: Diagnosis not present

## 2021-02-17 HISTORY — PX: COLONOSCOPY WITH PROPOFOL: SHX5780

## 2021-02-17 HISTORY — PX: POLYPECTOMY: SHX5525

## 2021-02-17 SURGERY — COLONOSCOPY WITH PROPOFOL
Anesthesia: General

## 2021-02-17 MED ORDER — LACTATED RINGERS IV SOLN: INTRAVENOUS | Status: DC

## 2021-02-17 MED ORDER — PROPOFOL 10 MG/ML IV BOLUS
INTRAVENOUS | Status: DC | PRN
  Administered 2021-02-17: 20 mg via INTRAVENOUS
  Administered 2021-02-17: 80 mg via INTRAVENOUS

## 2021-02-17 MED ORDER — LIDOCAINE HCL (CARDIAC) PF 100 MG/5ML IV SOSY
PREFILLED_SYRINGE | INTRAVENOUS | Status: DC | PRN
  Administered 2021-02-17: 50 mg via INTRAVENOUS

## 2021-02-17 MED ORDER — PROPOFOL 500 MG/50ML IV EMUL
INTRAVENOUS | Status: DC | PRN
Start: 2021-02-17 — End: 2021-02-17
  Administered 2021-02-17: 125 ug/kg/min via INTRAVENOUS

## 2021-02-17 NOTE — Anesthesia Preprocedure Evaluation (Addendum)
Anesthesia Evaluation  Patient identified by MRN, date of birth, ID band Patient awake    Reviewed: Allergy & Precautions, NPO status , Patient's Chart, lab work & pertinent test results  History of Anesthesia Complications (+) Family history of anesthesia reaction and history of anesthetic complications  Airway Mallampati: II  TM Distance: >3 FB Neck ROM: Full    Dental  (+) Dental Advisory Given [place] :   Pulmonary neg pulmonary ROS, former smoker,    Pulmonary exam normal breath sounds clear to auscultation       Cardiovascular hypertension, Pt. on medications Normal cardiovascular exam Rhythm:Regular Rate:Normal     Neuro/Psych negative neurological ROS  negative psych ROS   GI/Hepatic negative GI ROS, Neg liver ROS,   Endo/Other  negative endocrine ROS  Renal/GU negative Renal ROS  negative genitourinary   Musculoskeletal  (+) Arthritis , Osteoarthritis,    Abdominal   Peds negative pediatric ROS (+)  Hematology negative hematology ROS (+)   Anesthesia Other Findings   Reproductive/Obstetrics negative OB ROS                           Anesthesia Physical Anesthesia Plan  ASA: 2  Anesthesia Plan: General   Post-op Pain Management: Minimal or no pain anticipated   Induction:   PONV Risk Score and Plan: TIVA  Airway Management Planned: Nasal Cannula and Natural Airway  Additional Equipment:   Intra-op Plan:   Post-operative Plan:   Informed Consent: I have reviewed the patients History and Physical, chart, labs and discussed the procedure including the risks, benefits and alternatives for the proposed anesthesia with the patient or authorized representative who has indicated his/her understanding and acceptance.     Dental advisory given  Plan Discussed with: CRNA and Surgeon  Anesthesia Plan Comments:         Anesthesia Quick Evaluation

## 2021-02-17 NOTE — Discharge Instructions (Addendum)
Colonoscopy Discharge Instructions  Read the instructions outlined below and refer to this sheet in the next few weeks. These discharge instructions provide you with general information on caring for yourself after you leave the hospital. Your doctor may also give you specific instructions. While your treatment has been planned according to the most current medical practices available, unavoidable complications occasionally occur. If you have any problems or questions after discharge, call Dr. Gala Romney at [phone]. ACTIVITY You may resume your regular activity, but move at a slower pace for the next 24 hours.  Take frequent rest periods for the next 24 hours.  Walking will help get rid of the air and reduce the bloated feeling in your belly (abdomen).  No driving for 24 hours (because of the medicine (anesthesia) used during the test).   Do not sign any important legal documents or operate any machinery for 24 hours (because of the anesthesia used during the test).  NUTRITION Drink plenty of fluids.  You may resume your normal diet as instructed by your doctor.  Begin with a light meal and progress to your normal diet. Heavy or fried foods are harder to digest and may make you feel sick to your stomach (nauseated).  Avoid alcoholic beverages for 24 hours or as instructed.  MEDICATIONS You may resume your normal medications unless your doctor tells you otherwise.  WHAT YOU CAN EXPECT TODAY Some feelings of bloating in the abdomen.  Passage of more gas than usual.  Spotting of blood in your stool or on the toilet paper.  IF YOU HAD POLYPS REMOVED DURING THE COLONOSCOPY: No aspirin products for 7 days or as instructed.  No alcohol for 7 days or as instructed.  Eat a soft diet for the next 24 hours.  FINDING OUT THE RESULTS OF YOUR TEST Not all test results are available during your visit. If your test results are not back during the visit, make an appointment with your caregiver to find out the  results. Do not assume everything is normal if you have not heard from your caregiver or the medical facility. It is important for you to follow up on all of your test results.  SEEK IMMEDIATE MEDICAL ATTENTION IF: You have more than a spotting of blood in your stool.  Your belly is swollen (abdominal distention).  You are nauseated or vomiting.  You have a temperature over 101.  You have abdominal pain or discomfort that is severe or gets worse throughout the day.     1 small polyp removed your colon today  Colon polyp and diverticulosis information provided  Further recommendations to follow pending review of pathology report  Patient request, I called Yolonda at [phone]-call rolled to voicemail.  I left a detailed message.  PATIENT INSTRUCTIONS POST-ANESTHESIA  IMMEDIATELY FOLLOWING SURGERY:  Do not drive or operate machinery for the first twenty four hours after surgery.  Do not make any important decisions for twenty four hours after surgery or while taking narcotic pain medications or sedatives.  If you develop intractable nausea and vomiting or a severe headache please notify your doctor immediately.  FOLLOW-UP:  Please make an appointment with your surgeon as instructed. You do not need to follow up with anesthesia unless specifically instructed to do so.  WOUND CARE INSTRUCTIONS (if applicable):  Keep a dry clean dressing on the anesthesia/puncture wound site if there is drainage.  Once the wound has quit draining you may leave it open to air.  Generally you should leave the  bandage intact for twenty four hours unless there is drainage.  If the epidural site drains for more than 36-48 hours please call the anesthesia department.  QUESTIONS?:  Please feel free to call your physician or the hospital operator if you have any questions, and they will be happy to assist you.

## 2021-02-17 NOTE — Anesthesia Postprocedure Evaluation (Signed)
Anesthesia Post Note  Patient: April Gordon  Procedure(s) Performed: COLONOSCOPY WITH PROPOFOL POLYPECTOMY  Patient location during evaluation: Endoscopy Anesthesia Type: General Level of consciousness: awake and alert and oriented Pain management: pain level controlled Vital Signs Assessment: post-procedure vital signs reviewed and stable Respiratory status: spontaneous breathing, nonlabored ventilation and respiratory function stable Cardiovascular status: blood pressure returned to baseline and stable Postop Assessment: no apparent nausea or vomiting Anesthetic complications: no   No notable events documented.   Last Vitals:  Vitals:   02/17/21 0959 02/17/21 1002  BP: 103/68 119/75  Pulse: 82 80  Resp: 18 19  Temp:    SpO2: 98% 98%    Last Pain:  Vitals:   02/17/21 0946  TempSrc: Oral  PainSc:                  [place] C Tammela Bales

## 2021-02-17 NOTE — H&P (Signed)
@LOGO @   Primary Care Physician:  Celene Squibb, MD Primary Gastroenterologist:  Dr. Gala Romney  Pre-Procedure History & Physical: HPI:  April Gordon is a 81 y.o. female here for diagnostic colonoscopy.  Positive Cologuard.   No prior colonoscopy.  No bowel symptoms aside from chronic constipation.  1 of 10 children.  No family history of colon polyps or colon cancer. Past Medical History:  Diagnosis Date   Arthritis    Family history of adverse reaction to anesthesia    Daughter slow to wake up   HTN (hypertension)     Past Surgical History:  Procedure Laterality Date   JOINT REPLACEMENT     Left total knee Dr. Alvan Dame 05-11-17   TOTAL HIP ARTHROPLASTY Right 06/16/2016   Procedure: RIGHT TOTAL HIP ARTHROPLASTY ANTERIOR APPROACH;  Surgeon: Paralee Cancel, MD;  Location: [facility];  Service: Orthopedics;  Laterality: Right;   TOTAL KNEE ARTHROPLASTY Left 05/11/2017   Procedure: LEFT TOTAL KNEE ARTHROPLASTY;  Surgeon: Paralee Cancel, MD;  Location: [facility];  Service: Orthopedics;  Laterality: Left;  with block. a failed spinal so general anesthesia was adminstred.    TOTAL KNEE ARTHROPLASTY Right 04/18/2019   Procedure: TOTAL KNEE ARTHROPLASTY;  Surgeon: Paralee Cancel, MD;  Location: [facility];  Service: Orthopedics;  Laterality: Right;  70 mins    Prior to Admission medications   Medication Sig Start Date End Date Taking? Authorizing Provider  aspirin EC 81 MG tablet Take 81 mg by mouth daily. Swallow whole.   Yes [provider]  Calcium Carbonate-Vitamin D (CALCIUM-VITAMIN D3 PO) Take 1 tablet by mouth daily.   Yes [provider]  hydrochlorothiazide (HYDRODIURIL) 50 MG tablet Take 50 mg by mouth daily before lunch.   Yes [provider]  triamcinolone cream (KENALOG) 0.1 % Apply 1 application topically 3 (three) times daily as needed (dry/itchy scalp). 02/13/21  Yes [provider]  verapamil (CALAN-SR) 240 MG CR tablet Take 240 mg by mouth daily before lunch. 03/26/16   Yes [provider]  vitamin E 400 UNIT capsule Take 400 Units by mouth daily.   Yes [provider]  potassium chloride SA (KLOR-CON M) 20 MEQ tablet Take 2 tablets (40 mEq total) by mouth daily for 3 days. 02/14/21 02/17/21  Erenest Rasher, PA-C    Allergies as of 02/13/2021   (No Known Allergies)    Family History  Problem Relation Age of Onset   Arthritis Other     Social History   Socioeconomic History   Marital status: Widowed    Spouse name: Not on file   Number of children: Not on file   Years of education: 12.5   Highest education level: Not on file  Occupational History   Not on file  Tobacco Use   Smoking status: Former    Years: 6.00    Types: Cigarettes   Smokeless tobacco: Never   Tobacco comments:    40 years ago  Vaping Use   Vaping Use: Never used  Substance and Sexual Activity   Alcohol use: No   Drug use: No   Sexual activity: Not Currently  Other Topics Concern   Not on file  Social History Narrative   Not on file   Social Determinants of Health   Financial Resource Strain: Not on file  Food Insecurity: Not on file  Transportation Needs: Not on file  Physical Activity: Not on file  Stress: Not on file  Social Connections: Not on file  Intimate Partner  Violence: Not on file    Review of Systems: See HPI, otherwise negative ROS  Physical Exam: BP (!) 142/93    Pulse 88    Temp 97.7 F (36.5 C) (Oral)    Resp 13    SpO2 97%  General:   Alert,  Well-developed, well-nourished, pleasant and cooperative in NAD SNeck:  Supple; no masses or thyromegaly. No significant cervical adenopathy. Lungs:  Clear throughout to auscultation.   No wheezes, crackles, or rhonchi. No acute distress. Heart:  Regular rate and rhythm; no murmurs, clicks, rubs,  or gallops. Abdomen: Non-distended, normal bowel sounds.  Soft and nontender without appreciable mass or hepatosplenomegaly.  Pulses:  Normal pulses noted. Extremities:  Without  clubbing or edema.  Impression/Plan: 81 year old lady here for her first ever colonoscopy for diagnostic purpose.  Positive Cologuard.  Chronic constipation.  I have offered this patient a diagnostic colonoscopy per plan.  The risks, benefits, limitations, alternatives and imponderables have been reviewed with the patient. Questions have been answered. All parties are agreeable.       Notice: This dictation was prepared with Dragon dictation along with smaller phrase technology. Any transcriptional errors that result from this process are unintentional and may not be corrected upon review.

## 2021-02-17 NOTE — Transfer of Care (Signed)
Immediate Anesthesia Transfer of Care Note  Patient: April Gordon  Procedure(s) Performed: COLONOSCOPY WITH PROPOFOL POLYPECTOMY  Patient Location: Endoscopy Unit  Anesthesia Type:General  Level of Consciousness: Awake  Airway & Oxygen Therapy: Patient Spontanous Breathing  Post-op Assessment: Report given to RN and Post -op Vital signs reviewed and stable  Post vital signs: Reviewed and stable  Last Vitals:  Vitals Value Taken Time  BP    Temp    Pulse 73   Resp 13   SpO2 97%     Last Pain:  Vitals:   02/17/21 0915  TempSrc:   PainSc: 0-No pain         Complications: No notable events documented.

## 2021-02-17 NOTE — Op Note (Signed)
[facility] Patient Name: April Gordon Procedure Date: 02/17/2021 7:31 AM MRN: [id number] Date of Birth: 1940-08-03 Attending MD: Norvel Richards , MD CSN: [id number] Age: 81 Admit Type: Outpatient Procedure:                Colonoscopy Indications:              Positive Cologuard test Providers:                Norvel Richards, MD, Lambert Mody,                            Kristine L. Risa Grill, Technician Referring MD:              Medicines:                Propofol per Anesthesia Complications:            No immediate complications. Estimated Blood Loss:     Estimated blood loss was minimal. Procedure:                Pre-Anesthesia Assessment:                           - Prior to the procedure, a History and Physical                            was performed, and patient medications and                            allergies were reviewed. The patient's tolerance of                            previous anesthesia was also reviewed. The risks                            and benefits of the procedure and the sedation                            options and risks were discussed with the patient.                            All questions were answered, and informed consent                            was obtained. Prior Anticoagulants: The patient has                            taken no previous anticoagulant or antiplatelet                            agents. ASA Grade Assessment: II - A patient with                            mild systemic disease. After reviewing the risks  and benefits, the patient was deemed in                            satisfactory condition to undergo the procedure.                           After obtaining informed consent, the colonoscope                            was passed under direct vision. Throughout the                            procedure, the patient's blood pressure, pulse, and                            oxygen  saturations were monitored continuously. The                            [id number]) scope was introduced through the                            anus and advanced to the the cecum, identified by                            appendiceal orifice and ileocecal valve. Scope In: 9:18:59 AM Scope Out: 9:43:49 AM Scope Withdrawal Time: 0 hours 18 minutes 46 seconds  Total Procedure Duration: 0 hours 24 minutes 50 seconds  Findings:      The perianal and digital rectal examinations were normal.      Scattered small-mouthed diverticula were found in the sigmoid colon.      A 7 mm polyp was found in the ascending colon. The polyp was sessile.       The polyp was removed with a cold snare. Resection and retrieval were       complete. Estimated blood loss was minimal.      The exam was otherwise without abnormality on direct and retroflexion       views. Impression:               - Diverticulosis in the sigmoid colon.                           - One 7 mm polyp in the ascending colon, removed                            with a cold snare. Resected and retrieved.                           - The examination was otherwise normal on direct                            and retroflexion views. Moderate Sedation:      Moderate (conscious) sedation was personally administered by an       anesthesia professional. The following parameters were monitored: oxygen       saturation, heart rate, blood pressure, respiratory rate, EKG,  adequacy       of pulmonary ventilation, and response to care. Recommendation:           - Patient has a contact number available for                            emergencies. The signs and symptoms of potential                            delayed complications were discussed with the                            patient. Return to normal activities tomorrow.                            Written discharge instructions were provided to the                            patient.                            - Advance diet as tolerated.                           - Continue present medications.                           - Repeat colonoscopy date to be determined after                            pending pathology results are reviewed for                            surveillance.                           - Return to GI office (date not yet determined). Procedure Code(s):        --- Professional ---                           [phone], Colonoscopy, flexible; with removal of                            tumor(s), polyp(s), or other lesion(s) by snare                            technique Diagnosis Code(s):        --- Professional ---                           K63.5, Polyp of colon                           R19.5, Other fecal abnormalities                           K57.30, Diverticulosis of large intestine without  perforation or abscess without bleeding CPT copyright 2019 [employer]. All rights reserved. The codes documented in this report are preliminary and upon coder review may  be revised to meet current compliance requirements. Cristopher Estimable. Sadi Arave, MD Norvel Richards, MD 02/17/2021 10:40:51 AM This report has been signed electronically. Number of Addenda: 0

## 2021-02-25 LAB — SURGICAL PATHOLOGY

## 2021-02-26 ENCOUNTER — Encounter (HOSPITAL_COMMUNITY): Payer: Self-pay | Admitting: Internal Medicine

## 2021-07-09 DIAGNOSIS — E782 Mixed hyperlipidemia: Secondary | ICD-10-CM | POA: Diagnosis not present

## 2021-07-09 DIAGNOSIS — R7301 Impaired fasting glucose: Secondary | ICD-10-CM | POA: Diagnosis not present

## 2021-07-16 DIAGNOSIS — R748 Abnormal levels of other serum enzymes: Secondary | ICD-10-CM | POA: Diagnosis not present

## 2021-07-16 DIAGNOSIS — I10 Essential (primary) hypertension: Secondary | ICD-10-CM | POA: Diagnosis not present

## 2021-07-16 DIAGNOSIS — L853 Xerosis cutis: Secondary | ICD-10-CM | POA: Diagnosis not present

## 2021-07-16 DIAGNOSIS — E669 Obesity, unspecified: Secondary | ICD-10-CM | POA: Diagnosis not present

## 2021-07-16 DIAGNOSIS — E782 Mixed hyperlipidemia: Secondary | ICD-10-CM | POA: Diagnosis not present

## 2021-07-16 DIAGNOSIS — M8580 Other specified disorders of bone density and structure, unspecified site: Secondary | ICD-10-CM | POA: Diagnosis not present

## 2022-01-09 DIAGNOSIS — E782 Mixed hyperlipidemia: Secondary | ICD-10-CM | POA: Diagnosis not present

## 2022-01-16 DIAGNOSIS — Z6837 Body mass index (BMI) 37.0-37.9, adult: Secondary | ICD-10-CM | POA: Diagnosis not present

## 2022-01-16 DIAGNOSIS — Z79899 Other long term (current) drug therapy: Secondary | ICD-10-CM | POA: Diagnosis not present

## 2022-01-16 DIAGNOSIS — M8580 Other specified disorders of bone density and structure, unspecified site: Secondary | ICD-10-CM | POA: Diagnosis not present

## 2022-01-16 DIAGNOSIS — E669 Obesity, unspecified: Secondary | ICD-10-CM | POA: Diagnosis not present

## 2022-01-16 DIAGNOSIS — R7989 Other specified abnormal findings of blood chemistry: Secondary | ICD-10-CM | POA: Diagnosis not present

## 2022-01-16 DIAGNOSIS — E782 Mixed hyperlipidemia: Secondary | ICD-10-CM | POA: Diagnosis not present

## 2022-01-16 DIAGNOSIS — L853 Xerosis cutis: Secondary | ICD-10-CM | POA: Diagnosis not present

## 2022-01-16 DIAGNOSIS — Z0000 Encounter for general adult medical examination without abnormal findings: Secondary | ICD-10-CM | POA: Diagnosis not present

## 2022-01-16 DIAGNOSIS — I10 Essential (primary) hypertension: Secondary | ICD-10-CM | POA: Diagnosis not present

## 2022-06-19 DIAGNOSIS — Z0000 Encounter for general adult medical examination without abnormal findings: Secondary | ICD-10-CM | POA: Diagnosis not present

## 2022-07-21 DIAGNOSIS — E782 Mixed hyperlipidemia: Secondary | ICD-10-CM | POA: Diagnosis not present

## 2022-07-30 DIAGNOSIS — E669 Obesity, unspecified: Secondary | ICD-10-CM | POA: Diagnosis not present

## 2022-07-30 DIAGNOSIS — R011 Cardiac murmur, unspecified: Secondary | ICD-10-CM | POA: Diagnosis not present

## 2022-07-30 DIAGNOSIS — I10 Essential (primary) hypertension: Secondary | ICD-10-CM | POA: Diagnosis not present

## 2022-07-30 DIAGNOSIS — R21 Rash and other nonspecific skin eruption: Secondary | ICD-10-CM | POA: Diagnosis not present

## 2022-07-30 DIAGNOSIS — M8580 Other specified disorders of bone density and structure, unspecified site: Secondary | ICD-10-CM | POA: Diagnosis not present

## 2022-07-30 DIAGNOSIS — Z713 Dietary counseling and surveillance: Secondary | ICD-10-CM | POA: Diagnosis not present

## 2022-07-30 DIAGNOSIS — E782 Mixed hyperlipidemia: Secondary | ICD-10-CM | POA: Diagnosis not present

## 2022-07-30 DIAGNOSIS — Z6837 Body mass index (BMI) 37.0-37.9, adult: Secondary | ICD-10-CM | POA: Diagnosis not present

## 2022-11-16 DIAGNOSIS — Z23 Encounter for immunization: Secondary | ICD-10-CM | POA: Diagnosis not present

## 2022-11-30 DIAGNOSIS — L308 Other specified dermatitis: Secondary | ICD-10-CM | POA: Diagnosis not present

## 2022-11-30 DIAGNOSIS — L400 Psoriasis vulgaris: Secondary | ICD-10-CM | POA: Diagnosis not present

## 2022-11-30 DIAGNOSIS — D485 Neoplasm of uncertain behavior of skin: Secondary | ICD-10-CM | POA: Diagnosis not present

## 2022-12-08 DIAGNOSIS — R011 Cardiac murmur, unspecified: Secondary | ICD-10-CM | POA: Diagnosis not present

## 2022-12-08 DIAGNOSIS — R300 Dysuria: Secondary | ICD-10-CM | POA: Diagnosis not present

## 2022-12-08 DIAGNOSIS — I10 Essential (primary) hypertension: Secondary | ICD-10-CM | POA: Diagnosis not present

## 2022-12-14 DIAGNOSIS — N390 Urinary tract infection, site not specified: Secondary | ICD-10-CM | POA: Diagnosis not present

## 2022-12-14 DIAGNOSIS — R300 Dysuria: Secondary | ICD-10-CM | POA: Diagnosis not present

## 2022-12-14 DIAGNOSIS — J302 Other seasonal allergic rhinitis: Secondary | ICD-10-CM | POA: Diagnosis not present

## 2022-12-30 DIAGNOSIS — L400 Psoriasis vulgaris: Secondary | ICD-10-CM | POA: Diagnosis not present

## 2023-01-04 DIAGNOSIS — R300 Dysuria: Secondary | ICD-10-CM | POA: Diagnosis not present

## 2023-02-01 DIAGNOSIS — L400 Psoriasis vulgaris: Secondary | ICD-10-CM | POA: Diagnosis not present

## 2023-02-04 DIAGNOSIS — E782 Mixed hyperlipidemia: Secondary | ICD-10-CM | POA: Diagnosis not present

## 2023-02-04 DIAGNOSIS — R7301 Impaired fasting glucose: Secondary | ICD-10-CM | POA: Diagnosis not present

## 2023-02-04 DIAGNOSIS — R21 Rash and other nonspecific skin eruption: Secondary | ICD-10-CM | POA: Diagnosis not present

## 2023-02-10 DIAGNOSIS — I10 Essential (primary) hypertension: Secondary | ICD-10-CM | POA: Diagnosis not present

## 2023-02-10 DIAGNOSIS — R748 Abnormal levels of other serum enzymes: Secondary | ICD-10-CM | POA: Diagnosis not present

## 2023-02-10 DIAGNOSIS — E669 Obesity, unspecified: Secondary | ICD-10-CM | POA: Diagnosis not present

## 2023-02-10 DIAGNOSIS — Z0000 Encounter for general adult medical examination without abnormal findings: Secondary | ICD-10-CM | POA: Diagnosis not present

## 2023-02-10 DIAGNOSIS — M8580 Other specified disorders of bone density and structure, unspecified site: Secondary | ICD-10-CM | POA: Diagnosis not present

## 2023-02-10 DIAGNOSIS — E782 Mixed hyperlipidemia: Secondary | ICD-10-CM | POA: Diagnosis not present

## 2023-02-10 DIAGNOSIS — R011 Cardiac murmur, unspecified: Secondary | ICD-10-CM | POA: Diagnosis not present

## 2023-02-10 DIAGNOSIS — Z23 Encounter for immunization: Secondary | ICD-10-CM | POA: Diagnosis not present

## 2023-02-10 DIAGNOSIS — L853 Xerosis cutis: Secondary | ICD-10-CM | POA: Diagnosis not present

## 2023-03-08 DIAGNOSIS — L400 Psoriasis vulgaris: Secondary | ICD-10-CM | POA: Diagnosis not present

## 2023-08-18 DIAGNOSIS — I10 Essential (primary) hypertension: Secondary | ICD-10-CM | POA: Diagnosis not present

## 2023-08-25 DIAGNOSIS — R21 Rash and other nonspecific skin eruption: Secondary | ICD-10-CM | POA: Diagnosis not present

## 2023-08-25 DIAGNOSIS — R011 Cardiac murmur, unspecified: Secondary | ICD-10-CM | POA: Diagnosis not present

## 2023-08-25 DIAGNOSIS — E782 Mixed hyperlipidemia: Secondary | ICD-10-CM | POA: Diagnosis not present

## 2023-08-25 DIAGNOSIS — E669 Obesity, unspecified: Secondary | ICD-10-CM | POA: Diagnosis not present

## 2023-08-25 DIAGNOSIS — M8580 Other specified disorders of bone density and structure, unspecified site: Secondary | ICD-10-CM | POA: Diagnosis not present

## 2023-08-25 DIAGNOSIS — R748 Abnormal levels of other serum enzymes: Secondary | ICD-10-CM | POA: Diagnosis not present

## 2023-08-25 DIAGNOSIS — I10 Essential (primary) hypertension: Secondary | ICD-10-CM | POA: Diagnosis not present

## 2023-08-25 DIAGNOSIS — L853 Xerosis cutis: Secondary | ICD-10-CM | POA: Diagnosis not present

## 2023-08-25 DIAGNOSIS — M1990 Unspecified osteoarthritis, unspecified site: Secondary | ICD-10-CM | POA: Diagnosis not present

## 2023-09-08 DIAGNOSIS — I10 Essential (primary) hypertension: Secondary | ICD-10-CM | POA: Diagnosis not present

## 2024-02-22 ENCOUNTER — Encounter: Payer: Self-pay | Admitting: *Deleted

## 2024-02-22 NOTE — Progress Notes (Signed)
 April Gordon                                          MRN: [id number]   02/22/2024   The [facility] Quality Team Specialist reviewed this patient medical record for the purposes of chart review for care gap closure. The following were reviewed: chart review for care gap closure-controlling blood pressure.    [facility] Quality Team
